# Patient Record
Sex: Female | Born: 1955 | Race: White | Hispanic: No | Marital: Married | Smoking: Former smoker
Health system: Southern US, Community
[De-identification: ages and names within clinical notes are randomized; demographics above are authoritative.]

## PROBLEM LIST (undated history)

## (undated) DIAGNOSIS — I700 Atherosclerosis of aorta: Secondary | ICD-10-CM

## (undated) DIAGNOSIS — B029 Zoster without complications: Secondary | ICD-10-CM

## (undated) DIAGNOSIS — N762 Acute vulvitis: Secondary | ICD-10-CM

## (undated) DIAGNOSIS — E063 Autoimmune thyroiditis: Secondary | ICD-10-CM

## (undated) DIAGNOSIS — T8859XA Other complications of anesthesia, initial encounter: Secondary | ICD-10-CM

## (undated) DIAGNOSIS — E785 Hyperlipidemia, unspecified: Secondary | ICD-10-CM

## (undated) DIAGNOSIS — F32A Depression, unspecified: Secondary | ICD-10-CM

## (undated) DIAGNOSIS — E039 Hypothyroidism, unspecified: Secondary | ICD-10-CM

## (undated) DIAGNOSIS — Z780 Asymptomatic menopausal state: Secondary | ICD-10-CM

## (undated) DIAGNOSIS — F329 Major depressive disorder, single episode, unspecified: Secondary | ICD-10-CM

## (undated) DIAGNOSIS — N3281 Overactive bladder: Secondary | ICD-10-CM

## (undated) DIAGNOSIS — E559 Vitamin D deficiency, unspecified: Secondary | ICD-10-CM

## (undated) DIAGNOSIS — M8580 Other specified disorders of bone density and structure, unspecified site: Secondary | ICD-10-CM

## (undated) DIAGNOSIS — G4733 Obstructive sleep apnea (adult) (pediatric): Secondary | ICD-10-CM

## (undated) DIAGNOSIS — R55 Syncope and collapse: Secondary | ICD-10-CM

## (undated) DIAGNOSIS — D510 Vitamin B12 deficiency anemia due to intrinsic factor deficiency: Secondary | ICD-10-CM

## (undated) DIAGNOSIS — H3552 Pigmentary retinal dystrophy: Secondary | ICD-10-CM

## (undated) DIAGNOSIS — E538 Deficiency of other specified B group vitamins: Secondary | ICD-10-CM

## (undated) DIAGNOSIS — F419 Anxiety disorder, unspecified: Secondary | ICD-10-CM

## (undated) DIAGNOSIS — M810 Age-related osteoporosis without current pathological fracture: Secondary | ICD-10-CM

## (undated) DIAGNOSIS — T4145XA Adverse effect of unspecified anesthetic, initial encounter: Secondary | ICD-10-CM

## (undated) DIAGNOSIS — B009 Herpesviral infection, unspecified: Secondary | ICD-10-CM

## (undated) HISTORY — PX: TUBAL LIGATION: SHX77

## (undated) HISTORY — PX: TONSILLECTOMY: SUR1361

## (undated) HISTORY — PX: UPPER GI ENDOSCOPY: SHX6162

## (undated) HISTORY — PX: COLONOSCOPY: SHX174

## (undated) HISTORY — PX: CATARACT EXTRACTION: SUR2

## (undated) HISTORY — PX: CYST REMOVAL NECK: SHX6281

---

## 2004-08-11 ENCOUNTER — Ambulatory Visit: Payer: Self-pay | Admitting: Obstetrics and Gynecology

## 2005-01-20 ENCOUNTER — Emergency Department: Payer: Self-pay | Admitting: Emergency Medicine

## 2005-08-14 ENCOUNTER — Ambulatory Visit: Payer: Self-pay | Admitting: Obstetrics and Gynecology

## 2006-08-17 ENCOUNTER — Ambulatory Visit: Payer: Self-pay | Admitting: Obstetrics and Gynecology

## 2007-09-27 ENCOUNTER — Ambulatory Visit: Payer: Self-pay | Admitting: Obstetrics and Gynecology

## 2007-10-28 ENCOUNTER — Ambulatory Visit: Payer: Self-pay | Admitting: Unknown Physician Specialty

## 2008-11-15 ENCOUNTER — Ambulatory Visit: Payer: Self-pay | Admitting: Obstetrics and Gynecology

## 2009-07-18 ENCOUNTER — Ambulatory Visit: Payer: Self-pay | Admitting: Obstetrics and Gynecology

## 2009-09-18 ENCOUNTER — Emergency Department: Payer: Self-pay | Admitting: Internal Medicine

## 2009-11-19 ENCOUNTER — Ambulatory Visit: Payer: Self-pay | Admitting: Obstetrics and Gynecology

## 2010-06-10 NOTE — Assessment & Plan Note (Signed)
NAMEDINESHA, TWIGGS NO.:  [id number]   MEDICAL RECORD NO.:  [id number]          PATIENT TYPE:  POB   LOCATION:  [facility] at [facility]         FACILITY:  [facility]   PHYSICIAN:  Argentina Donovan, MD        DATE OF BIRTH:  Aug 24, 1955   DATE OF SERVICE:  07/18/2009                                  CLINIC NOTE   The patient is a 55 year old Caucasian female gravida 2, para 1-0-1-1,  who went through menopause, stopping periods 5 years ago and has not  been on estrogen since then, however, she has been tested and was found  to have osteopenia, very close to osteoporosis.  She has been since  menopause on Boniva once a month, vitamin D, and calcium.  She has also  been on Prozac for menopausal symptoms, acyclovir, omeprazole and  thyroid.  The reason she is in is that 3-4 days ago because of a density  scan which had not changed fairly in 2 years since the last one, but was  close to osteoporosis on one of the readings in one of the hips  apparently.  They started her on Prempro in addition to all the things  she has been on.  She came in for a consultation to see whether I  thought that was a wise thing to do.  The fact being that she has been 5  years without estrogen and then restarting follows in the line of the  nurses study in which all those women had been off estrogen for some  time after menopause prior to starting and showed an increase in  cardiovascular problems.  I told her I did not think it was worth that  it would be very unlikely in view of what she is taking rather Boniva as  well as a calcium and vitamin D that it will improve the structure or  the density of her bones.  She is not doing any exercises, although she  has all the exercise equipment at home, and I have encouraged her to  start doing that, that probably would have a better effect on bone than  just adding the Provera.  She does have some vaginal dryness.  I have  talked to her about using the  lubricants that are available and told her  there was also a relatively new study that showed there was really a  minimal of systemic effect by using just estrogen tablets in the vagina  to reestablish the superficial lining and increase lubrication if that  became necessary.  She seemed to go along with this.  She seemed to be  pleased with the advice.  She has not bothered terribly by hot flashes  or sleep disturbances so for the lack of severe postmenopausal symptoms  the patient directly feels with the exception of a light vaginal  dryness.  I think that the risks are greater than the advantage of her  adding Prempro to her current regime.   IMPRESSION:  Osteopenia.  The patient with early menopause and symptoms  fairly well controlled.           ______________________________  Argentina Donovan, MD  PR/MEDQ  D:  07/18/2009  T:  07/19/2009  Job:  [id number]

## 2010-12-03 ENCOUNTER — Ambulatory Visit: Payer: Self-pay | Admitting: Obstetrics and Gynecology

## 2011-12-08 ENCOUNTER — Ambulatory Visit: Payer: Self-pay | Admitting: Obstetrics and Gynecology

## 2012-01-20 ENCOUNTER — Emergency Department: Payer: Self-pay | Admitting: Emergency Medicine

## 2012-01-20 LAB — TROPONIN I: Troponin-I: <0.02 ng/mL

## 2012-01-20 LAB — COMPREHENSIVE METABOLIC PANEL
Albumin: 4.1 g/dL (ref 3.4–5.0)
Anion Gap: 7 (ref 7–16)
BUN: 14 mg/dL (ref 7–18)
Calcium, Total: 9.1 mg/dL (ref 8.5–10.1)
Co2: 24 mmol/L (ref 21–32)
EGFR (African American): >60
Glucose: 101 mg/dL — ABNORMAL HIGH (ref 65–99)
Osmolality: 287 (ref 275–301)
Potassium: 4 mmol/L (ref 3.5–5.1)
SGOT(AST): 35 U/L (ref 15–37)
SGPT (ALT): 34 U/L (ref 12–78)

## 2012-01-20 LAB — CBC
HCT: 43.6 % (ref 35.0–47.0)
Platelet: 205 10*3/uL (ref 150–440)
RBC: 4.82 10*6/uL (ref 3.80–5.20)
RDW: 13.5 % (ref 11.5–14.5)
WBC: 9.8 10*3/uL (ref 3.6–11.0)

## 2012-01-20 LAB — URINALYSIS, COMPLETE
Bacteria: NONE SEEN
Bilirubin,UR: NEGATIVE
Protein: NEGATIVE
RBC,UR: 1 /HPF (ref 0–5)
Specific Gravity: 1.017 (ref 1.003–1.030)
Squamous Epithelial: <1
WBC UR: 2 /HPF (ref 0–5)

## 2012-01-21 ENCOUNTER — Observation Stay: Payer: Self-pay | Admitting: Internal Medicine

## 2012-01-21 LAB — COMPREHENSIVE METABOLIC PANEL
Albumin: 3.7 g/dL (ref 3.4–5.0)
Anion Gap: 9 (ref 7–16)
BUN: 16 mg/dL (ref 7–18)
Bilirubin,Total: 0.6 mg/dL (ref 0.2–1.0)
Co2: 22 mmol/L (ref 21–32)
Creatinine: 0.78 mg/dL (ref 0.60–1.30)
EGFR (African American): >60
Glucose: 81 mg/dL (ref 65–99)
Potassium: 3.5 mmol/L (ref 3.5–5.1)
Sodium: 141 mmol/L (ref 136–145)
Total Protein: 6.9 g/dL (ref 6.4–8.2)

## 2012-01-21 LAB — CBC WITH DIFFERENTIAL/PLATELET
Basophil #: 0.2 10*3/uL — ABNORMAL HIGH (ref 0.0–0.1)
Lymphocyte #: 1.9 10*3/uL (ref 1.0–3.6)
MCH: 29.5 pg (ref 26.0–34.0)
MCV: 90 fL (ref 80–100)
Monocyte #: 0.6 x10 3/mm (ref 0.2–0.9)
Neutrophil #: 4.7 10*3/uL (ref 1.4–6.5)
Platelet: 189 10*3/uL (ref 150–440)
RDW: 13.3 % (ref 11.5–14.5)

## 2012-01-21 LAB — URINALYSIS, COMPLETE
Bilirubin,UR: NEGATIVE
Glucose,UR: NEGATIVE mg/dL (ref 0–75)
Ph: 6 (ref 4.5–8.0)
Protein: NEGATIVE
Specific Gravity: 1.004 (ref 1.003–1.030)
WBC UR: 1 /HPF (ref 0–5)

## 2012-01-22 LAB — CBC WITH DIFFERENTIAL/PLATELET
Basophil %: 0.8 %
Eosinophil %: 2.3 %
HGB: 12.8 g/dL (ref 12.0–16.0)
Lymphocyte #: 1.8 10*3/uL (ref 1.0–3.6)
MCH: 30 pg (ref 26.0–34.0)
MCHC: 33.7 g/dL (ref 32.0–36.0)
MCV: 89 fL (ref 80–100)
Monocyte #: 0.5 x10 3/mm (ref 0.2–0.9)
Neutrophil #: 2.5 10*3/uL (ref 1.4–6.5)
Neutrophil %: 50.6 %
Platelet: 176 10*3/uL (ref 150–440)
RBC: 4.28 10*6/uL (ref 3.80–5.20)

## 2012-01-22 LAB — TSH: Thyroid Stimulating Horm: 1.56 u[IU]/mL

## 2012-01-22 LAB — OCCULT BLOOD X 1 CARD TO LAB, STOOL: Occult Blood, Feces: NEGATIVE

## 2012-01-25 LAB — PATHOLOGY REPORT

## 2012-01-27 HISTORY — PX: BREAST CYST ASPIRATION: SHX578

## 2012-12-08 ENCOUNTER — Ambulatory Visit: Payer: Self-pay | Admitting: Obstetrics and Gynecology

## 2013-03-20 ENCOUNTER — Observation Stay: Payer: Self-pay | Admitting: Internal Medicine

## 2013-03-20 LAB — CBC
HCT: 40.1 % (ref 35.0–47.0)
HGB: 13.3 g/dL (ref 12.0–16.0)
MCH: 29.8 pg (ref 26.0–34.0)
MCHC: 33.1 g/dL (ref 32.0–36.0)
MCV: 90 fL (ref 80–100)
Platelet: 179 10*3/uL (ref 150–440)
RBC: 4.46 10*6/uL (ref 3.80–5.20)
RDW: 13.5 % (ref 11.5–14.5)
WBC: 11.2 10*3/uL — ABNORMAL HIGH (ref 3.6–11.0)

## 2013-03-20 LAB — COMPREHENSIVE METABOLIC PANEL
ALK PHOS: 95 U/L
AST: 36 U/L (ref 15–37)
Albumin: 3.5 g/dL (ref 3.4–5.0)
Anion Gap: 5 — ABNORMAL LOW (ref 7–16)
BUN: 18 mg/dL (ref 7–18)
Bilirubin,Total: 0.9 mg/dL (ref 0.2–1.0)
Calcium, Total: 8.6 mg/dL (ref 8.5–10.1)
Chloride: 109 mmol/L — ABNORMAL HIGH (ref 98–107)
Co2: 24 mmol/L (ref 21–32)
Creatinine: 0.76 mg/dL (ref 0.60–1.30)
EGFR (Non-African Amer.): >60
GLUCOSE: 95 mg/dL (ref 65–99)
OSMOLALITY: 277 (ref 275–301)
Potassium: 3.6 mmol/L (ref 3.5–5.1)
SGPT (ALT): 34 U/L (ref 12–78)
SODIUM: 138 mmol/L (ref 136–145)
Total Protein: 6.6 g/dL (ref 6.4–8.2)

## 2013-03-20 LAB — URINALYSIS, COMPLETE
BILIRUBIN, UR: NEGATIVE
Bacteria: NONE SEEN
Glucose,UR: NEGATIVE mg/dL (ref 0–75)
LEUKOCYTE ESTERASE: NEGATIVE
NITRITE: NEGATIVE
Ph: 5 (ref 4.5–8.0)
Protein: NEGATIVE
RBC,UR: 3 /HPF (ref 0–5)
Specific Gravity: 1.025 (ref 1.003–1.030)
WBC UR: 3 /HPF (ref 0–5)

## 2013-03-20 LAB — LIPASE, BLOOD: Lipase: 120 U/L (ref 73–393)

## 2013-03-21 LAB — BASIC METABOLIC PANEL
ANION GAP: 7 (ref 7–16)
BUN: 9 mg/dL (ref 7–18)
CHLORIDE: 113 mmol/L — AB (ref 98–107)
CO2: 23 mmol/L (ref 21–32)
Calcium, Total: 7.9 mg/dL — ABNORMAL LOW (ref 8.5–10.1)
Creatinine: 0.68 mg/dL (ref 0.60–1.30)
EGFR (African American): >60
EGFR (Non-African Amer.): >60
GLUCOSE: 111 mg/dL — AB (ref 65–99)
Osmolality: 284 (ref 275–301)
POTASSIUM: 3.6 mmol/L (ref 3.5–5.1)
SODIUM: 143 mmol/L (ref 136–145)

## 2013-03-21 LAB — CBC WITH DIFFERENTIAL/PLATELET
BASOS PCT: 0.2 %
Basophil #: 0 10*3/uL (ref 0.0–0.1)
EOS PCT: 0.4 %
Eosinophil #: 0 10*3/uL (ref 0.0–0.7)
HCT: 35.2 % (ref 35.0–47.0)
HGB: 11.9 g/dL — ABNORMAL LOW (ref 12.0–16.0)
LYMPHS PCT: 14.6 %
Lymphocyte #: 1 10*3/uL (ref 1.0–3.6)
MCH: 30.2 pg (ref 26.0–34.0)
MCHC: 33.7 g/dL (ref 32.0–36.0)
MCV: 90 fL (ref 80–100)
Monocyte #: 0.7 x10 3/mm (ref 0.2–0.9)
Monocyte %: 9.6 %
NEUTROS ABS: 5.1 10*3/uL (ref 1.4–6.5)
Neutrophil %: 75.2 %
PLATELETS: 144 10*3/uL — AB (ref 150–440)
RBC: 3.94 10*6/uL (ref 3.80–5.20)
RDW: 13.7 % (ref 11.5–14.5)
WBC: 6.8 10*3/uL (ref 3.6–11.0)

## 2013-03-21 LAB — OCCULT BLOOD X 1 CARD TO LAB, STOOL: Occult Blood, Feces: NEGATIVE

## 2013-03-21 LAB — MAGNESIUM: MAGNESIUM: 1.8 mg/dL

## 2013-03-22 LAB — CBC WITH DIFFERENTIAL/PLATELET
BASOS ABS: 0 10*3/uL (ref 0.0–0.1)
Basophil %: 0.3 %
EOS ABS: 0.1 10*3/uL (ref 0.0–0.7)
Eosinophil %: 2.1 %
HCT: 34 % — ABNORMAL LOW (ref 35.0–47.0)
HGB: 11.4 g/dL — AB (ref 12.0–16.0)
Lymphocyte #: 1.6 10*3/uL (ref 1.0–3.6)
Lymphocyte %: 23.4 %
MCH: 30.2 pg (ref 26.0–34.0)
MCHC: 33.7 g/dL (ref 32.0–36.0)
MCV: 90 fL (ref 80–100)
MONO ABS: 0.9 x10 3/mm (ref 0.2–0.9)
Monocyte %: 12.8 %
Neutrophil #: 4.2 10*3/uL (ref 1.4–6.5)
Neutrophil %: 61.4 %
Platelet: 135 10*3/uL — ABNORMAL LOW (ref 150–440)
RBC: 3.79 10*6/uL — ABNORMAL LOW (ref 3.80–5.20)
RDW: 13.6 % (ref 11.5–14.5)
WBC: 6.9 10*3/uL (ref 3.6–11.0)

## 2013-03-22 LAB — BASIC METABOLIC PANEL
Anion Gap: 3 — ABNORMAL LOW (ref 7–16)
BUN: 4 mg/dL — AB (ref 7–18)
CALCIUM: 8.2 mg/dL — AB (ref 8.5–10.1)
CHLORIDE: 116 mmol/L — AB (ref 98–107)
Co2: 26 mmol/L (ref 21–32)
Creatinine: 0.78 mg/dL (ref 0.60–1.30)
EGFR (African American): >60
GLUCOSE: 84 mg/dL (ref 65–99)
Osmolality: 285 (ref 275–301)
Potassium: 3.2 mmol/L — ABNORMAL LOW (ref 3.5–5.1)
Sodium: 145 mmol/L (ref 136–145)

## 2013-03-22 LAB — MAGNESIUM: Magnesium: 1.8 mg/dL

## 2013-03-24 LAB — STOOL CULTURE

## 2013-04-24 DIAGNOSIS — E559 Vitamin D deficiency, unspecified: Secondary | ICD-10-CM | POA: Insufficient documentation

## 2013-04-24 DIAGNOSIS — D510 Vitamin B12 deficiency anemia due to intrinsic factor deficiency: Secondary | ICD-10-CM | POA: Insufficient documentation

## 2013-12-04 DIAGNOSIS — E042 Nontoxic multinodular goiter: Secondary | ICD-10-CM | POA: Insufficient documentation

## 2013-12-19 ENCOUNTER — Ambulatory Visit: Payer: Self-pay | Admitting: Obstetrics and Gynecology

## 2014-05-15 NOTE — H&P (Signed)
PATIENT NAME:  Brenda Oneill, Brenda Oneill MR#:  [id number] DATE OF BIRTH:  [birth year]  DATE OF ADMISSION:  01/21/2012  PRIMARY CARE PHYSICIAN: Ramonita Lab, MD  REASON FOR ADMISSION: Intractable nausea and vomiting with abdominal pain.   HISTORY OF PRESENT ILLNESS: The patient is a 59 year old female with a history of pernicious anemia, hypothyroidism and hyperlipidemia who was in the Emergency Room last night with acid reflux, diarrhea and anorexia. There was questionable coffee-ground emesis. Her CBC was normal and she was sent out with the diagnosis of gastritis. She presents now with persistent nausea, vomiting, diarrhea and anorexia. Having epigastric pain. Unable to keep even water down. She is now admitted for further evaluation.   PAST MEDICAL HISTORY: 1. Pernicious anemia.  2. Hypothyroidism.  3. History of retinitis pigmentosa.  4. Osteopenia with vitamin D deficiency.  5. Remote history of shingles.  6. Hyperlipidemia.  7. History of near syncope.  8. Status post tubal ligation.  9. Status post cataract surgery.   MEDICATIONS: 1. Armour thyroid 30 mg p.o. daily.  2. Vitamin D 250,000 units p.o. 2 times per week.  3. B12 1000 mcg IM q. month.  4. Prozac 20 mg p.o. daily.  5. Premarin vaginal cream twice per week.   ALLERGIES: MACRODANTIN.   SOCIAL HISTORY: Negative for alcohol or tobacco abuse.   FAMILY HISTORY: Positive for heart disease and hypertension. Also positive for lung cancer and Parkinson's. Had a great aunt with breast cancer. No history of colon cancer in the family.   REVIEW OF SYSTEMS: As per HPI.   PHYSICAL EXAMINATION:  GENERAL: The patient is in no acute distress.   VITAL SIGNS: Currently blood pressure is 104/62 with a weight of 141.   HEENT: Exam is unremarkable.   NECK: Supple without JVD.   PULMONARY: Lungs are clear. No wheezes or rales. No rhonchi.   CARDIAC: Regular rate and rhythm with normal S1 and S2. No significant rubs, murmurs or gallops.  PMI is nondisplaced. Chest wall is nontender.   ABDOMEN: Soft with some epigastric tenderness. No rebound or guarding. Normoactive bowel sounds. No organomegaly or masses were appreciated. No hernias or bruits were noted.   EXTREMITIES: No clubbing, cyanosis or edema. Pulses were 2+ bilaterally.   SKIN: Warm and dry without rash or lesions.   NEUROLOGIC: Cranial nerves II through XII grossly intact. Deep tendon reflexes were symmetric. Motor and sensory exam is nonfocal.   PSYCHIATRIC: Examination reveals a patient who is alert and oriented to person, place and time. She was cooperative and used good judgment.   ASSESSMENT: 1. Intractable nausea and vomiting.  2. Epigastric abdominal pain.  3. Anorexia.  4. Diarrhea.  5. Hypothyroidism.  6. Vitamin D deficiency.  7. Pernicious anemia.   PLAN: The patient will be admitted to the floor with IV fluids and maintained on a clear liquid diet. We will guaiac all stools and consult gastroenterology. We will obtain an abdominal ultrasound. We will send off routine labs including a repeat CBC and a H. pylori antibody. We will check a urinalysis. Follow up hemoglobin and thyroid in the morning. Further treatment and evaluation will depend upon the patient's progress.   TOTAL TIME SPENT: 50 minutes.  ____________________________ Leonie Douglas Doy Hutching, MD jds:sb D: 01/21/2012 11:19:30 ET T: 01/21/2012 11:30:42 ET JOB#: [id number]  cc: Leonie Douglas. Doy Hutching, MD, <Dictator> [name], MD Bernestine Holsapple Lennice Sites MD ELECTRONICALLY SIGNED 01/21/2012 15:58

## 2014-05-15 NOTE — Consult Note (Signed)
Brief Consult Note: Diagnosis: Intractable nausea and vomiting.  Exacerbation of reflux with associated diarrhea.  Abdominal pain to epigastrium and right upper quadrant.  Dehydration. Differential inclusive of gastrenteritis, gallbladder disease as well as acute gastritis.   Consult note dictated.   Recommend to proceed with surgery or procedure.   Orders entered.   Discussed with Attending MD.   Comments: Patient's presentation discussed with Dr. Verdie Shire.  Will proceed with abdominal ultrasound as ordered.  If possible finding in correlatiion with current symptoms will not proceed with endoscopy evaluation.  Tentatively patient is scheduled for EGD tomorrow to allow direct luminal evaluation.  Order placed.  Clear liquid diet today and NPO after midnight.  Stool studies ordered with differential being inclusive of gastroenteritis.  Comprehensive stool, O&P with giardia and Stool for WBC.  Will continue to monitor hemodynamic status as well as laboratory studies..  Electronic Signatures: Payton Emerald (NP)  (Signed 26-Dec-13 13:45)  Authored: Brief Consult Note   Last Updated: 26-Dec-13 13:45 by Payton Emerald (NP)

## 2014-05-15 NOTE — Consult Note (Signed)
Chief Complaint:   Subjective/Chief Complaint See Dawn Harrison's notes from yest. Feels much better. No heartburn this AM. No RUQ pain . U/S neg. Stool studies pending.   VITAL SIGNS/ANCILLARY NOTES: **Vital Signs.:   27-Dec-13 03:50   Vital Signs Type Routine   Temperature Temperature (F) 98.2   Celsius 36.7   Temperature Source Oral   Pulse Pulse 62   Respirations Respirations 20   Systolic BP Systolic BP 376   Diastolic BP (mmHg) Diastolic BP (mmHg) 65   Mean BP 77   Pulse Ox % Pulse Ox % 97   Pulse Ox Activity Level  At rest   Oxygen Delivery Room Air/ 21 %   Brief Assessment:   Cardiac Regular    Respiratory clear BS    Gastrointestinal Normal   Lab Results:  Thyroid:  27-Dec-13 05:04    Thyroid Stimulating Hormone 1.56 (0.45-4.50 (International Unit)  ----------------------- Pregnant patients have  different reference  ranges for TSH:  - - - - - - - - - -  Pregnant, first trimetser:  0.36 - 2.50 uIU/mL)   Radiology Results: [place]:    26-Dec-13 14:40, US Abdomen General Survey   US Abdomen General Survey    REASON FOR EXAM:    abd pain, N/V  COMMENTS:       PROCEDURE: [place]  - US ABDOMEN GENERAL SURVEY  - Jan 21 2012  2:40PM     RESULT: The liver exhibits normal echotexture with no focal mass nor   ductal dilation. Portal venous flow is normal in directiontoward the   liver. The gallbladder is adequately distended with no evidence of   stones, wall thickening, or pericholecystic fluid. There is no positive   sonographic Murphy's sign. The common bile duct measures 3.1 mm in   diameter. The pancreas could only be partially evaluated due to bowel   gas. The spleen, abdominal aorta, inferior vena cava, and kidneys are   normal in appearance. There is no evidence of ascites.    IMPRESSION:  There is limited evaluation of the pancreas. Otherwise no     acute abnormality of the visualized abdominal viscera is seen.     Dictation Site: 2        Verified By:  DAVID A. Martinique, M.D., MD   Assessment/Plan:  Assessment/Plan:   Assessment GERD. N/V. Feels back to normal.    Plan EGD later this AM. Hopefully, discharge to home afterwards on daily PPI. THanks.   Electronic Signatures: Verdie Shire (MD)  (Signed 27-Dec-13 07:55)  Authored: Chief Complaint, VITAL SIGNS/ANCILLARY NOTES, Brief Assessment, Lab Results, Radiology Results, Assessment/Plan   Last Updated: 27-Dec-13 07:55 by Verdie Shire (MD)

## 2014-05-15 NOTE — Consult Note (Signed)
EGD only showed benign gastric polyps. No ulcers or obvious GERD. Nevertheless, continue daily PPI. Solids ordered. If tolerated, patient can go home today. Will sign off. Thanks.  Electronic Signatures: Verdie Shire (MD)  (Signed on 27-Dec-13 11:14)  Authored  Last Updated: 27-Dec-13 11:14 by Verdie Shire (MD)

## 2014-05-15 NOTE — Consult Note (Signed)
   Electronic Signatures: Payton Emerald (NP)  (Signed 26-Dec-13 13:46)  Authored: Brief Consult Note   Last Updated: 26-Dec-13 13:46 by Payton Emerald (NP)

## 2014-05-18 NOTE — Consult Note (Signed)
PATIENT NAME:  Brenda Oneill, Brenda Oneill MR#:  [id number] DATE OF BIRTH:  Jun 25, 1955  DATE OF CONSULTATION:  01/21/2012  REFERRING PHYSICIAN:   CONSULTING PHYSICIAN:  [name], MD/Dawn Ruthe Mannan, NP  PRIMARY CARE PHYSICIAN:  Tama High, III, MD  REASON FOR CONSULTATION:  Nausea and vomiting with abdominal pain.   HISTORY OF PRESENT ILLNESS:  The patient is a 59 year old Caucasian female with a known history of pernicious anemia, hypothyroidism, hyperlipidemia, osteopenia as well as menopausal syndrome. She was seen at the [facility] Emergency Room yesterday evening for the concern of intractable nausea, vomiting, diarrhea as well as associated abdominal pain. The patient states this past Sunday she had eaten a hot dog with chili and stated that she thought that it was "not good." Monday she awoke with reflux significant for water brash and excessive belching and that morning also acute onset of diarrhea. She took Maalox, Mylanta and Prilosec with no improvement in her symptoms. Tuesday there was no diarrhea, but reflux was still present significant for water brash with more of a bile appearance initially and then followed by a second occurrence of coffee-ground "looking emesis" thought possibly to be dried blood. During the rest of the day, the reflux remained severe and then yesterday the diarrhea and reflux as well are still being present. She had a very small amount of macaroni and cheese and became nauseated. At 5:00 p.m. yesterday, she had an occurrence of diarrhea. No evidence of melena though through the onset of these symptoms and no evidence of bright red blood over the past couple of days. A month ago she had evidence of 1 occurrence of bright red blood and has a known history of internal hemorrhoids. She had a colonoscopy that was performed by Dr. Gaylyn Cheers on 10/28/2007 with the finding of internal hemorrhoids. No history of EGD in the past. No similar symptoms to this severity. Known  history of reflux, but has been off of PPI therapy for years. When she was leaving her family function yesterday she had the occurrence of diarrhea walking to the car and vomited. Given darkness, she was unable to notice if it was coffee-ground in appearance at that time. In the ER yesterday evening, she was given IV fluids, prescription for Zofran as well as PPI therapy and diagnosed with gastritis. This morning awoke, drank a half bottle of water and then significant for water brash again. Ended up taking a shower preparing to come to the doctor, drank the other half of water and significant for water brash. She has had 1 episode of diarrhea this morning. Normal bowel pattern is usually every other day. Very rare will she take Advil for a headache and has had intentional weight loss recently.   PAST MEDICAL HISTORY:  Pernicious anemia, hypothyroidism, history of retinitis pigmentosa, osteopenia with vitamin D deficiency, remote history of shingles, hyperlipidemia, near syncopal episode, menopausal syndrome.   PAST SURGICAL HISTORY:  Tonsillectomy, tubal ligation, cataract surgery bilaterally, C-section in 1976, appendectomy.  MEDICATIONS:  Armour Thyroid 30 mg once a day, vitamin D 250,000 units twice a week, vitamin B12, 1000 mcg injection monthly, Prozac 20 mg a day, Premarin vaginal cream twice a week and then acyclovir as directed.   ALLERGIES:  MACRODANTIN.   SOCIAL HISTORY:  No tobacco. No alcohol use. Married, 1 child, works for [employer]   FAMILY HISTORY:  Significant for heart disease and hypertension. Father has a history of lung cancer as well as Parkinson's disease and a great  aunt with breast cancer. No family history of colon cancer.   REVIEW OF SYSTEMS:  All 10 systems reviewed and checked, otherwise unremarkable as stated above except for the complaint of mild dizziness.   PHYSICAL EXAMINATION: VITAL SIGNS: Temperature 97.4, pulse 64, respirations 20, blood pressure 129/80 and pulse  oximetry 99% on room air.  GENERAL: Well-developed, well-nourished 59 year old Caucasian female in no acute distress noted. Pleasant, appears to be resting comfortably in bed.  HEENT: Normocephalic, atraumatic. Pupils equal, reactive to light. Conjunctivae clear. Sclerae anicteric. Lips are dry with evidence of dehydration.  NECK: Supple. Trachea midline. No lymphadenopathy or thyromegaly.  PULMONARY: Symmetric rise and fall of chest. Clear to auscultation throughout.  CARDIOVASCULAR: Regular rhythm, S1, S2. No murmurs, no gallops.  ABDOMEN: Soft, nondistended. Bowel sounds in 4 quadrants. No bruits. No masses. Tenderness noted in epigastric right upper quadrant. No evidence of hepatosplenomegaly. No CVA tenderness.  RECTAL: Deferred.  MUSCULOSKELETAL: No contractures. No clubbing.  EXTREMITIES: No edema.  SKIN: Color pink, warm, dry, fair skin turgor. No lesions. No rashes.  NEUROLOGICAL: No gross neurological deficits noted.  PSYCHIATRIC: Alert and oriented x 4. Appropriate affect and mood.   DIAGNOSTIC DATA:  Laboratory studies reviewed from yesterday when seen in the Emergency Room are glucose 101, chloride 113, otherwise within normal limits. Lipase was 166. Hepatic panel was within normal limits. Troponin was less than 0.02. CBC was within normal limits. Urinalysis revealed +1 ketones and +2 blood. Chest x-ray, PA and lateral, revealed mild hyperinflation which may be voluntary which could reflect underlying reactive airway disease or COPD. No evidence of pneumonia. A small amount of gas under the left hemidiaphragm which likely lies within the stomach.   IMPRESSION:  Intractable nausea and vomiting, exacerbation of reflux with associated diarrhea, abdominal pain, epigastric as well as right upper quadrant, dehydration. Differential includes gastritis, gallbladder disease as well as acute gastritis.   PLAN:  The patient's presentation was discussed with Dr. Verdie Shire. We will proceed with  abdominal ultrasound as ordered. If positive findings in correlation with current symptoms, we will proceed with upper endoscopy evaluation. Tentatively, the patient is scheduled for an EGD tomorrow to allow direct luminal evaluation, order placed. Clear liquid diet today, n.p.o. after midnight. Stool studies ordered with differential being inclusive of gastroenteritis, comprehensive stool, ova and parasite with Giardia and stool for WBC, and we will continue to monitor hemodynamic status as well as laboratory studies.   These services provided by Payton Emerald , NP under collaborative agreement with Dr. Verdie Shire.  ____________________________ Payton Emerald, NP dsh:si D: 01/21/2012 13:45:00 ET T: 01/21/2012 17:03:23 ET JOB#: [id number]  cc: Payton Emerald, NP, <Dictator> Payton Emerald MD ELECTRONICALLY SIGNED 02/01/2012 16:43

## 2014-05-19 NOTE — Discharge Summary (Signed)
PATIENT NAME:  Brenda Oneill, Brenda Oneill MR#:  [id number] DATE OF BIRTH:  08-Aug-1955  DATE OF ADMISSION:  03/20/2013 DATE OF DISCHARGE:  03/22/2013   FINAL DIAGNOSES:  1. Dehydration.  2. Nausea, vomiting, likely viral gastroenteritis, and cause of the dehydration.  3. Hypothyroidism.  4. Depression with anxiety.   HISTORY AND PHYSICAL: Please see dictated admission history and physical.   [place]: The patient was admitted with nausea, vomiting, diarrhea. Fortunately, this improved rapidly. She was placed on IV fluids for dehydration. Her diet was slowly advanced. On the day of discharge, she is tolerating solid foods, she has been ambulating independently and feels ready to go home. She will follow a bland diet for 1 week and then advance this to regular diet. She may return to work on Monday, physical activity otherwise would be as tolerated. She will follow up in our office in the next 2 to 4 weeks.   DISCHARGE MEDICATIONS:  1. Armour Thyroid 30 mg p.o. daily.  2. Fluoxetine 20 mg p.o. daily.  3. Vitamin B12 1000 mcg IM monthly.  4. Vitamin D2 50,000 units p.o. 2 times a week.  5. Zovirax 200 mg p.o. daily.  6. Alprazolam 0.25 mg p.o. at bedtime as needed for sleep.  7. Ondansetron 4 mg p.o. t.i.d. as needed for nausea or vomiting.   ____________________________ [name], MD bjk:lb D: 03/22/2013 12:50:16 ET T: 03/22/2013 14:13:06 ET JOB#: [id number]  cc: [name], MD, <Dictator> Ramonita Lab MD ELECTRONICALLY SIGNED 03/23/2013 10:48

## 2014-05-19 NOTE — H&P (Signed)
PATIENT NAME:  Brenda Oneill, Brenda Oneill MR#:  [id number] DATE OF BIRTH:  [birth year]  DATE OF ADMISSION:  03/20/2013  PRIMARY CARE PHYSICIAN:  Tama High III, MD  CHIEF COMPLAINT: Abdominal pain, nausea, vomiting, diarrhea.   HISTORY OF PRESENT ILLNESS: This is a 59 year old female who presents with vomiting too many times to count, diarrhea too many times to count, starting last night. No blood in the vomit, no blood in the stool. This morning vomited 3 times after drinking a small amount of Gatorade and unable to keep anything down. Today, she felt faint. The EMS stated that her blood pressure was a little bit low. Of note, she did come in contact with a lady that did her hair on Saturday that had a stomach virus. She did eat a piece of chicken at a restaurant and then, 4 hours later, developed symptoms. Of note, she did have a similar episode back in 2013 right after Christmas with some persistent vomiting.   PAST MEDICAL HISTORY: Pernicious anemia, hypothyroidism, retinitis pigmentosa, osteopenia with vitamin D deficiency, shingles in the past, hyperlipidemia.   PAST SURGICAL HISTORY: Tubal ligation, cataract surgery and eye surgery.   ALLERGIES: MACRODANTIN.   SOCIAL HISTORY: Works for the [employer]. No alcohol. No smoking. No drug use.   FAMILY HISTORY: Mother with diabetes and arthritis. Father with diabetes, heart disease.   MEDICATIONS:  Include acyclovir 200 mg daily, Xanax 0.25 mg at bedtime, Armour Thyroid 30 mg daily, fluoxetine 20 mg daily, vitamin B 1000 mcg/mL injectable solution IM once a month, vitamin D2 50,000 units twice a week on Sunday and Wednesday.   REVIEW OF SYSTEMS: CONSTITUTIONAL: Positive for chills. No fever. No sweats. Positive for weakness.  EYES: No blurry vision.  EARS, NOSE, MOUTH AND THROAT: No sore throat. Positive for hoarse voice. CARDIOVASCULAR: No chest pain. No palpitations.  RESPIRATORY: No shortness of breath. No cough. No  sputum. No hemoptysis.  GASTROINTESTINAL: Positive for nausea. Positive for vomiting. No hematemesis. Positive for abdominal pain. Positive for diarrhea. No bright red blood per rectum. No melena.  GENITOURINARY: No burning on urination. No hematuria.  MUSCULOSKELETAL: No joint pain or muscle pain.  INTEGUMENT: No rashes or eruptions.  NEUROLOGIC: Felt faint.  ENDOCRINE: Positive for hypothyroidism.  HEMATOLOGIC AND LYMPHATIC: No anemia.   PHYSICAL EXAMINATION: VITAL SIGNS: Temperature 97, pulse 70, respirations 20, blood pressure 112/55, pulse ox 99% on room air.  GENERAL: No respiratory distress.  EYES: Conjunctivae and lids normal. Pupils equal, round and reactive to light. Extraocular muscles intact. No nystagmus.  EARS, NOSE, MOUTH AND THROAT: Tympanic membranes: No erythema. Nasal mucosa: No erythema. Throat: No erythema. No exudate seen. Lips and gums: No lesions.  NECK: No JVD. No bruits. No lymphadenopathy. No thyromegaly. No thyroid nodules palpated.  LUNGS: Clear to auscultation. No use of accessory muscles to breathe. No rhonchi, rales or wheeze heard.  CARDIOVASCULAR SYSTEM: S1, S2 normal. No gallops, rubs or murmurs heard. Carotid upstroke 2+ bilaterally. No bruits. Dorsalis pedis pulses 2+ bilaterally. No edema of the lower extremity.  ABDOMEN:  Soft. Tenderness throughout the upper and lower abdomen. No organomegaly/splenomegaly. Normoactive bowel sounds. No masses felt.  LYMPHATIC: No lymph nodes in the neck.  MUSCULOSKELETAL: No clubbing, edema or cyanosis.  SKIN: No rashes or ulcers seen.  NEUROLOGIC: Cranial nerves II through XII grossly intact.  PSYCHIATRIC: The patient is alert and oriented to person, place and time.   LABORATORY AND RADIOLOGICAL DATA:  Urinalysis trace ketones, 1+ blood,  otherwise negative. White blood cell count 11.2, H and H 13.3 and 40.1, platelet count 179. Lipase 120, glucose 95, BUN 18, creatinine 0.76, sodium 138, potassium 3.6, chloride 109, CO2  24, calcium 8.6. Liver function tests normal range.   ASSESSMENT AND PLAN: 1.  Nausea, vomiting, diarrhea and abdominal pain, likely a viral gastroenteritis. Could also be a bacterial gastroenteritis or even a food poisoning. Will give conservative management with IV fluid hydration, p.r.n. nausea medications, p.r.n. pain medications. We will send off stool studies if any further diarrhea and continue to monitor closely.  2.  Hypothyroidism. Continue Armour Thyroid.  3.  Depression and anxiety. On fluoxetine and alprazolam.  4.  Vitamin D deficiency. Can continue vitamin D as outpatient.  5.  B12 deficiency. On monthly IM B12 shots, can continue as outpatient.   TIME SPENT ON ADMISSION: 55 minutes.    ____________________________ Tana Conch. Leslye Peer, MD rjw:cs D: 03/20/2013 16:10:96 ET T: 03/20/2013 20:53:54 ET JOB#: [id number]  cc: Tana Conch. Leslye Peer, MD, <Dictator> Tama High III, MD Marisue Brooklyn MD ELECTRONICALLY SIGNED 03/27/2013 13:27

## 2014-12-11 ENCOUNTER — Other Ambulatory Visit: Payer: Self-pay | Admitting: Obstetrics and Gynecology

## 2014-12-11 DIAGNOSIS — Z1231 Encounter for screening mammogram for malignant neoplasm of breast: Secondary | ICD-10-CM

## 2014-12-17 ENCOUNTER — Ambulatory Visit: Payer: Self-pay | Admitting: Physician Assistant

## 2014-12-17 ENCOUNTER — Encounter: Payer: Self-pay | Admitting: Physician Assistant

## 2014-12-17 VITALS — BP 120/70 | HR 71 | Temp 97.9°F

## 2014-12-17 DIAGNOSIS — J069 Acute upper respiratory infection, unspecified: Secondary | ICD-10-CM

## 2014-12-17 DIAGNOSIS — B379 Candidiasis, unspecified: Secondary | ICD-10-CM

## 2014-12-17 MED ORDER — FLUCONAZOLE 150 MG PO TABS: 150.0000 mg | ORAL_TABLET | Freq: Once | ORAL | Status: DC

## 2014-12-17 MED ORDER — FLUTICASONE PROPIONATE 50 MCG/ACT NA SUSP: 2.0000 | Freq: Every day | NASAL | Status: DC

## 2014-12-17 MED ORDER — CEFDINIR 300 MG PO CAPS: 300.0000 mg | ORAL_CAPSULE | Freq: Two times a day (BID) | ORAL | Status: DC

## 2014-12-17 NOTE — Progress Notes (Signed)
S: C/o runny nose and congestion for 3 days, no fever, chills, cp/sob, v/d; mucus is green and thick, cough is sporadic, c/o of facial and dental pain.   Using otc meds:   O: PE: perrl eomi, normocephalic, tms dull, nasal mucosa red and swollen, throat injected, neck supple no lymph, lungs c t a, cv rrr, neuro intact  A:  Acute sinusitis, acute uri   P: omnicef 300mg  bid, dilfucan, flonase;  drink fluids, continue regular meds , use otc meds of choice, return if not improving in 5 days, return earlier if worsening

## 2014-12-25 ENCOUNTER — Ambulatory Visit
Admission: RE | Admit: 2014-12-25 | Discharge: 2014-12-25 | Disposition: A | Discharge status: Home / Self Care | Payer: PRIVATE HEALTH INSURANCE | Source: Ambulatory Visit | Attending: Obstetrics and Gynecology | Admitting: Obstetrics and Gynecology

## 2014-12-25 DIAGNOSIS — Z1231 Encounter for screening mammogram for malignant neoplasm of breast: Secondary | ICD-10-CM | POA: Insufficient documentation

## 2015-02-21 ENCOUNTER — Other Ambulatory Visit: Payer: Self-pay | Admitting: Emergency Medicine

## 2015-02-21 NOTE — Telephone Encounter (Signed)
Received a faxed medication request from [employer].  Please advise.  Thank you.

## 2015-05-27 ENCOUNTER — Ambulatory Visit: Payer: Self-pay | Admitting: Registered Nurse

## 2015-05-27 VITALS — BP 120/65 | HR 66 | Temp 97.6°F

## 2015-05-27 DIAGNOSIS — E039 Hypothyroidism, unspecified: Secondary | ICD-10-CM | POA: Insufficient documentation

## 2015-05-27 DIAGNOSIS — M8580 Other specified disorders of bone density and structure, unspecified site: Secondary | ICD-10-CM | POA: Insufficient documentation

## 2015-05-27 DIAGNOSIS — J0101 Acute recurrent maxillary sinusitis: Secondary | ICD-10-CM

## 2015-05-27 DIAGNOSIS — E785 Hyperlipidemia, unspecified: Secondary | ICD-10-CM | POA: Insufficient documentation

## 2015-05-27 DIAGNOSIS — G4730 Sleep apnea, unspecified: Secondary | ICD-10-CM | POA: Insufficient documentation

## 2015-05-27 DIAGNOSIS — J302 Other seasonal allergic rhinitis: Secondary | ICD-10-CM

## 2015-05-27 DIAGNOSIS — H3552 Pigmentary retinal dystrophy: Secondary | ICD-10-CM | POA: Insufficient documentation

## 2015-05-27 DIAGNOSIS — B029 Zoster without complications: Secondary | ICD-10-CM | POA: Insufficient documentation

## 2015-05-27 DIAGNOSIS — H6593 Unspecified nonsuppurative otitis media, bilateral: Secondary | ICD-10-CM

## 2015-05-27 MED ORDER — SALINE SPRAY 0.65 % NA SOLN: 1.0000 | NASAL | Status: DC | PRN

## 2015-05-27 MED ORDER — ACETAMINOPHEN 500 MG PO TABS: 1000.0000 mg | ORAL_TABLET | Freq: Four times a day (QID) | ORAL | Status: AC | PRN

## 2015-05-27 MED ORDER — FLUTICASONE PROPIONATE 50 MCG/ACT NA SUSP: 1.0000 | Freq: Two times a day (BID) | NASAL | Status: DC

## 2015-05-27 MED ORDER — AMOXICILLIN-POT CLAVULANATE 400-57 MG PO CHEW: 2.0000 | CHEWABLE_TABLET | Freq: Two times a day (BID) | ORAL | Status: AC

## 2015-05-27 NOTE — Progress Notes (Signed)
Subjective:    Patient ID: Brenda Oneill, female    DOB: 1955-07-24, 60 y.o.   MRN: [id number]  HPI Comments: Married caucasian female here for evaluation right ear/throat pain, post nasal drip, sinus congestion, headache; fever yesterday took advil helped some last dose last night; seasonal allergies on allegra D po daily ran out of flonase needs refill.  Reported sinus infection in past year  Sore Throat  This is a new problem. The current episode started in the past 7 days. The problem has been gradually worsening. The pain is worse on the right side. The maximum temperature recorded prior to her arrival was 100.4 - 100.9 F. The fever has been present for less than 1 day. The pain is at a severity of 5/10. The pain is moderate. Associated symptoms include congestion, ear pain, headaches and a plugged ear sensation. Pertinent negatives include no abdominal pain, coughing, diarrhea, drooling, ear discharge, hoarse voice, neck pain, shortness of breath, stridor, swollen glands, trouble swallowing or vomiting. She has had exposure to strep. She has had no exposure to mono. She has tried NSAIDs and cool liquids for the symptoms. The treatment provided mild relief.  Sinusitis Associated symptoms include congestion, ear pain, headaches, sinus pressure and a sore throat. Pertinent negatives include no chills, coughing, diaphoresis, hoarse voice, neck pain, shortness of breath, sneezing or swollen glands.      Review of Systems  Constitutional: Positive for fever. Negative for chills, diaphoresis, activity change, appetite change, fatigue and unexpected weight change.  HENT: Positive for congestion, ear pain, postnasal drip, rhinorrhea, sinus pressure and sore throat. Negative for dental problem, drooling, ear discharge, facial swelling, hearing loss, hoarse voice, mouth sores, nosebleeds, sneezing, tinnitus, trouble swallowing and voice change.   Eyes: Negative for photophobia, pain, discharge,  redness, itching and visual disturbance.  Respiratory: Negative for cough, choking, chest tightness, shortness of breath, wheezing and stridor.   Cardiovascular: Negative for chest pain, palpitations and leg swelling.  Gastrointestinal: Negative for nausea, vomiting, abdominal pain, diarrhea, constipation, blood in stool and abdominal distention.  Endocrine: Negative for cold intolerance and heat intolerance.  Genitourinary: Negative for dysuria, hematuria and difficulty urinating.  Musculoskeletal: Negative for myalgias, back pain, joint swelling, arthralgias, gait problem, neck pain and neck stiffness.  Skin: Negative for color change, pallor, rash and wound.  Allergic/Immunologic: Positive for environmental allergies. Negative for food allergies.  Neurological: Positive for headaches. Negative for dizziness, tremors, seizures, syncope, facial asymmetry, speech difficulty, weakness, light-headedness and numbness.  Hematological: Negative for adenopathy. Does not bruise/bleed easily.  Psychiatric/Behavioral: Negative for behavioral problems, confusion, sleep disturbance and agitation.       Objective:   Physical Exam  Constitutional: She is oriented to person, place, and time. She appears well-developed and well-nourished. She is active and cooperative.  Non-toxic appearance. She does not have a sickly appearance. She appears ill. No distress.  HENT:  Head: Normocephalic and atraumatic.  Right Ear: Hearing, external ear and ear canal normal. A middle ear effusion is present.  Left Ear: Hearing, external ear and ear canal normal. A middle ear effusion is present.  Nose: Mucosal edema and rhinorrhea present. No nose lacerations, sinus tenderness, nasal deformity, septal deviation or nasal septal hematoma. No epistaxis.  No foreign bodies. Right sinus exhibits maxillary sinus tenderness and frontal sinus tenderness. Left sinus exhibits maxillary sinus tenderness and frontal sinus tenderness.   Mouth/Throat: Uvula is midline and mucous membranes are normal. Mucous membranes are not pale, not dry and not cyanotic.  She does not have dentures. No oral lesions. No trismus in the jaw. Normal dentition. No dental abscesses, uvula swelling, lacerations or dental caries. Posterior oropharyngeal edema and posterior oropharyngeal erythema present. No oropharyngeal exudate or tonsillar abscesses.  Cobblestoning posterior pharynx; oropharynx with macular erythema; tonsils 1+/4 bilaterally edema/erythema; bilateral nasal turbinates edema/erythema; bilateral TMs with air fluid level clear; bilateral allergic shiners  Eyes: Conjunctivae, EOM and lids are normal. Pupils are equal, round, and reactive to light. Right eye exhibits no chemosis, no discharge, no exudate and no hordeolum. No foreign body present in the right eye. Left eye exhibits no chemosis, no discharge, no exudate and no hordeolum. No foreign body present in the left eye. Right conjunctiva is not injected. Right conjunctiva has no hemorrhage. Left conjunctiva is not injected. Left conjunctiva has no hemorrhage. No scleral icterus. Right eye exhibits normal extraocular motion and no nystagmus. Left eye exhibits normal extraocular motion and no nystagmus. Right pupil is round and reactive. Left pupil is round and reactive. Pupils are equal.  Neck: Trachea normal and normal range of motion. Neck supple. No tracheal tenderness, no spinous process tenderness and no muscular tenderness present. No rigidity. No tracheal deviation, no edema, no erythema and normal range of motion present. No thyroid mass and no thyromegaly present.  Cardiovascular: Normal rate, regular rhythm, S1 normal, S2 normal, normal heart sounds and intact distal pulses.  PMI is not displaced.  Exam reveals no gallop and no friction rub.   No murmur heard. Pulmonary/Chest: Effort normal and breath sounds normal. No accessory muscle usage or stridor. No respiratory distress. She has  no decreased breath sounds. She has no wheezes. She has no rhonchi. She has no rales. She exhibits no tenderness.  Abdominal: Soft. She exhibits no distension.  Musculoskeletal: Normal range of motion. She exhibits no edema or tenderness.       Right shoulder: Normal.       Left shoulder: Normal.       Right hip: Normal.       Left hip: Normal.       Right knee: Normal.       Left knee: Normal.       Cervical back: Normal.       Right hand: Normal.       Left hand: Normal.  Lymphadenopathy:       Head (right side): No submental, no submandibular, no tonsillar, no preauricular, no posterior auricular and no occipital adenopathy present.       Head (left side): No submental, no submandibular, no tonsillar, no preauricular, no posterior auricular and no occipital adenopathy present.    She has no cervical adenopathy.       Right cervical: No superficial cervical, no deep cervical and no posterior cervical adenopathy present.      Left cervical: No superficial cervical, no deep cervical and no posterior cervical adenopathy present.  Neurological: She is alert and oriented to person, place, and time. She has normal strength. She is not disoriented. She displays no atrophy and no tremor. No cranial nerve deficit or sensory deficit. She exhibits normal muscle tone. She displays no seizure activity. Coordination and gait normal. GCS eye subscore is 4. GCS verbal subscore is 5. GCS motor subscore is 6.  Skin: Skin is warm, dry and intact. No abrasion, no bruising, no burn, no ecchymosis, no laceration, no lesion, no petechiae and no rash noted. She is not diaphoretic. No cyanosis or erythema. No pallor. Nails show no clubbing.  Psychiatric:  She has a normal mood and affect. Her speech is normal and behavior is normal. Judgment and thought content normal. Cognition and memory are normal.  Nursing note and vitals reviewed.         Assessment & Plan:  A-acute recurrent maxillary and frontal  sinusitis, bilateral otitis media with effusion; seasonal allergic rhinitis  P-Supportive treatment.   No evidence of invasive bacterial infection, non toxic and well hydrated.  This is most likely self limiting viral infection.  I do not see where any further testing or imaging is necessary at this time.   I will suggest supportive care, rest, good hygiene and encourage the patient to take adequate fluids.  The patient is to return to clinic or EMERGENCY ROOM if symptoms worsen or change significantly e.g. ear pain, fever, purulent discharge from ears or bleeding.  Patient verbalized agreement and understanding of treatment plan.    Post nasal drip from sinusitis and seasonal allergies.  I do not see where any further testing or imaging is necessary at this time. 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  Since treating sinusitis with augmentin also covers for strep.  Patient did not want testing today.  Suggest supportive care, rest, good hygiene and encourage the patient to take adequate fluids.  Does not require work excuse.continue allegra D po daily; add flonase 1 spray each nostril BID prn, nasal saline 1-2 sprays each nostril prn q2h, tylenol 1000mg  po QID; if no relief may add advil 800mg  po TID prn but discussed with patient could interact with her fluoxetine hyponatremia, bleeding/bruising, SIADH.  Discussed honey with lemon and salt water gargles for comfort also.  The patient is to return to clinic or EMERGENCY ROOM if symptoms worsen or change significantly e.g. fever, lethargy, SOB, wheezing.  Patient verbalized agreement and understanding of treatment plan.    Patient may use normal saline nasal spray as needed.  Continue allegra d po daily.  Restart flonase 1 spray each nostril BID.  Avoid triggers if possible.  Shower prior to bedtime if exposed to triggers.  If allergic dust/dust mites recommend mattress/pillow covers/encasements; washing  linens, vacuuming, sweeping, dusting weekly.  Call or return to clinic as needed if these symptoms worsen or fail to improve as anticipated.   Exitcare handout on allergic rhinitis given to patient.  Patient verbalized understanding of instructions, agreed with plan of care and had no further questions at this time.  P2:  Avoidance and hand washing.  Restart flonase 1 spray each nostril BID, saline 2 sprays each nostril q2h prn congestion.  start augmentin 875mg  po BID x 10 days.  Chewables as patient difficulty swallowing large pills.  Rx given.  No evidence of systemic bacterial infection, non toxic and well hydrated.  I do not see where any further testing or imaging is necessary at this time.   I will suggest supportive care, rest, good hygiene and encourage the patient to take adequate fluids.  The patient is to return to clinic or EMERGENCY ROOM if symptoms worsen or change significantly.  Patient verbalized agreement and understanding of treatment plan and had no further questions at this time.   P2:  Hand washing and cover cough

## 2015-11-19 ENCOUNTER — Ambulatory Visit: Payer: Self-pay | Admitting: Physician Assistant

## 2015-11-19 ENCOUNTER — Encounter: Payer: Self-pay | Admitting: Physician Assistant

## 2015-11-19 VITALS — BP 119/70 | HR 77 | Temp 98.3°F

## 2015-11-19 DIAGNOSIS — J0100 Acute maxillary sinusitis, unspecified: Secondary | ICD-10-CM

## 2015-11-19 MED ORDER — FEXOFENADINE-PSEUDOEPHED ER 180-240 MG PO TB24: 1.0000 | ORAL_TABLET | Freq: Every day | ORAL | 12 refills | Status: DC

## 2015-11-19 MED ORDER — AMOXICILLIN 875 MG PO TABS: 875.0000 mg | ORAL_TABLET | Freq: Two times a day (BID) | ORAL | 0 refills | Status: DC

## 2015-11-19 MED ORDER — PREDNISONE 10 MG PO TABS: 30.0000 mg | ORAL_TABLET | Freq: Every day | ORAL | 0 refills | Status: DC

## 2015-11-19 NOTE — Progress Notes (Signed)
S: C/o runny nose and congestion for 7 days, no fever, chills, cp/sob, v/d; mucus is green and thick in the mornings, clear the rest of the day, has allergies but thinks its turned into infection, has bad taste in her mouth, c/o of facial pain across forehead.   Using otc meds:   O: PE: perrl eomi, normocephalic, tms dull, nasal mucosa red and swollen, throat injected, neck supple no lymph, lungs c t a, cv rrr, neuro intact  A:  Acute sinusitis   P: drink fluids, continue regular meds , use otc meds of choice, return if not improving in 5 days, return earlier if worsening , prednisone 30mg  qd x 3d, amoxil, allegra -d 24 h

## 2015-12-02 DIAGNOSIS — D229 Melanocytic nevi, unspecified: Secondary | ICD-10-CM

## 2015-12-02 HISTORY — DX: Melanocytic nevi, unspecified: D22.9

## 2015-12-23 ENCOUNTER — Other Ambulatory Visit: Payer: Self-pay | Admitting: Obstetrics and Gynecology

## 2015-12-23 DIAGNOSIS — Z1231 Encounter for screening mammogram for malignant neoplasm of breast: Secondary | ICD-10-CM

## 2016-01-30 ENCOUNTER — Ambulatory Visit
Admission: RE | Admit: 2016-01-30 | Discharge: 2016-01-30 | Disposition: A | Discharge status: Home / Self Care | Payer: Managed Care, Other (non HMO) | Source: Ambulatory Visit | Attending: Obstetrics and Gynecology | Admitting: Obstetrics and Gynecology

## 2016-01-30 ENCOUNTER — Ambulatory Visit: Payer: PRIVATE HEALTH INSURANCE

## 2016-01-30 DIAGNOSIS — Z1231 Encounter for screening mammogram for malignant neoplasm of breast: Secondary | ICD-10-CM | POA: Diagnosis not present

## 2016-03-13 ENCOUNTER — Encounter
Admission: EM | Disposition: A | Discharge status: Home / Self Care | Payer: Self-pay | Source: Home / Self Care | Attending: Internal Medicine

## 2016-03-13 ENCOUNTER — Emergency Department: Payer: Managed Care, Other (non HMO)

## 2016-03-13 ENCOUNTER — Encounter: Payer: Self-pay | Admitting: Emergency Medicine

## 2016-03-13 ENCOUNTER — Inpatient Hospital Stay
Admission: EM | Admit: 2016-03-13 | Discharge: 2016-03-14 | DRG: 197 | Disposition: A | Discharge status: Home / Self Care | Payer: Managed Care, Other (non HMO) | Attending: Internal Medicine | Admitting: Internal Medicine

## 2016-03-13 DIAGNOSIS — Z79899 Other long term (current) drug therapy: Secondary | ICD-10-CM

## 2016-03-13 DIAGNOSIS — R739 Hyperglycemia, unspecified: Secondary | ICD-10-CM | POA: Diagnosis present

## 2016-03-13 DIAGNOSIS — I248 Other forms of acute ischemic heart disease: Secondary | ICD-10-CM | POA: Diagnosis present

## 2016-03-13 DIAGNOSIS — J189 Pneumonia, unspecified organism: Secondary | ICD-10-CM

## 2016-03-13 DIAGNOSIS — J849 Interstitial pulmonary disease, unspecified: Principal | ICD-10-CM | POA: Diagnosis present

## 2016-03-13 DIAGNOSIS — E063 Autoimmune thyroiditis: Secondary | ICD-10-CM | POA: Diagnosis present

## 2016-03-13 DIAGNOSIS — D72829 Elevated white blood cell count, unspecified: Secondary | ICD-10-CM

## 2016-03-13 DIAGNOSIS — R030 Elevated blood-pressure reading, without diagnosis of hypertension: Secondary | ICD-10-CM

## 2016-03-13 DIAGNOSIS — M542 Cervicalgia: Secondary | ICD-10-CM | POA: Diagnosis present

## 2016-03-13 DIAGNOSIS — I214 Non-ST elevation (NSTEMI) myocardial infarction: Secondary | ICD-10-CM

## 2016-03-13 DIAGNOSIS — F419 Anxiety disorder, unspecified: Secondary | ICD-10-CM | POA: Diagnosis present

## 2016-03-13 DIAGNOSIS — R110 Nausea: Secondary | ICD-10-CM

## 2016-03-13 DIAGNOSIS — R079 Chest pain, unspecified: Secondary | ICD-10-CM | POA: Diagnosis not present

## 2016-03-13 DIAGNOSIS — R7989 Other specified abnormal findings of blood chemistry: Secondary | ICD-10-CM

## 2016-03-13 DIAGNOSIS — G4733 Obstructive sleep apnea (adult) (pediatric): Secondary | ICD-10-CM | POA: Diagnosis present

## 2016-03-13 DIAGNOSIS — F329 Major depressive disorder, single episode, unspecified: Secondary | ICD-10-CM | POA: Diagnosis present

## 2016-03-13 DIAGNOSIS — R778 Other specified abnormalities of plasma proteins: Secondary | ICD-10-CM

## 2016-03-13 HISTORY — PX: LEFT HEART CATH AND CORONARY ANGIOGRAPHY: CATH118249

## 2016-03-13 HISTORY — DX: Autoimmune thyroiditis: E06.3

## 2016-03-13 HISTORY — DX: Deficiency of other specified B group vitamins: E53.8

## 2016-03-13 HISTORY — DX: Pigmentary retinal dystrophy: H35.52

## 2016-03-13 HISTORY — DX: Depression, unspecified: F32.A

## 2016-03-13 HISTORY — DX: Major depressive disorder, single episode, unspecified: F32.9

## 2016-03-13 HISTORY — DX: Vitamin D deficiency, unspecified: E55.9

## 2016-03-13 HISTORY — DX: Vitamin B12 deficiency anemia due to intrinsic factor deficiency: D51.0

## 2016-03-13 HISTORY — DX: Acute vulvitis: N76.2

## 2016-03-13 HISTORY — DX: Other specified disorders of bone density and structure, unspecified site: M85.80

## 2016-03-13 HISTORY — DX: Syncope and collapse: R55

## 2016-03-13 HISTORY — DX: Anxiety disorder, unspecified: F41.9

## 2016-03-13 HISTORY — DX: Obstructive sleep apnea (adult) (pediatric): G47.33

## 2016-03-13 HISTORY — DX: Hypothyroidism, unspecified: E03.9

## 2016-03-13 HISTORY — DX: Overactive bladder: N32.81

## 2016-03-13 HISTORY — DX: Zoster without complications: B02.9

## 2016-03-13 HISTORY — DX: Herpesviral infection, unspecified: B00.9

## 2016-03-13 LAB — BASIC METABOLIC PANEL
ANION GAP: 8 (ref 5–15)
BUN: 14 mg/dL (ref 6–20)
CALCIUM: 9.1 mg/dL (ref 8.9–10.3)
CO2: 25 mmol/L (ref 22–32)
CREATININE: 0.68 mg/dL (ref 0.44–1.00)
Chloride: 109 mmol/L (ref 101–111)
GFR calc non Af Amer: >60 mL/min (ref >60)
Glucose, Bld: 100 mg/dL — ABNORMAL HIGH (ref 65–99)
Potassium: 3.9 mmol/L (ref 3.5–5.1)
SODIUM: 142 mmol/L (ref 135–145)

## 2016-03-13 LAB — CBC
HCT: 39.4 % (ref 35.0–47.0)
HEMOGLOBIN: 13.5 g/dL (ref 12.0–16.0)
MCH: 30.1 pg (ref 26.0–34.0)
MCHC: 34.2 g/dL (ref 32.0–36.0)
MCV: 88 fL (ref 80.0–100.0)
PLATELETS: 235 10*3/uL (ref 150–440)
RBC: 4.48 MIL/uL (ref 3.80–5.20)
RDW: 13.3 % (ref 11.5–14.5)
WBC: 9.8 10*3/uL (ref 3.6–11.0)

## 2016-03-13 LAB — PROTIME-INR
INR: 0.98
PROTHROMBIN TIME: 13 s (ref 11.4–15.2)

## 2016-03-13 LAB — TROPONIN I
TROPONIN I: 0.09 ng/mL — AB (ref <0.03)
Troponin I: 0.14 ng/mL (ref <0.03)

## 2016-03-13 LAB — APTT: APTT: 32 s (ref 24–36)

## 2016-03-13 SURGERY — LEFT HEART CATH AND CORONARY ANGIOGRAPHY
Anesthesia: Choice

## 2016-03-13 MED ORDER — FLUOXETINE HCL 20 MG PO CAPS
40.0000 mg | ORAL_CAPSULE | Freq: Every day | ORAL | Status: DC
  Administered 2016-03-14: 40 mg via ORAL
  Filled 2016-03-13: qty 2

## 2016-03-13 MED ORDER — IOPAMIDOL (ISOVUE-300) INJECTION 61%
INTRAVENOUS | Status: DC | PRN
  Administered 2016-03-13: 70 mL via INTRA_ARTERIAL

## 2016-03-13 MED ORDER — ACETAMINOPHEN 325 MG PO TABS: 650.0000 mg | ORAL_TABLET | Freq: Four times a day (QID) | ORAL | Status: DC | PRN

## 2016-03-13 MED ORDER — MIDAZOLAM HCL 2 MG/2ML IJ SOLN
INTRAMUSCULAR | Status: DC | PRN
  Administered 2016-03-13: 1 mg via INTRAVENOUS

## 2016-03-13 MED ORDER — ONDANSETRON HCL 4 MG/2ML IJ SOLN: 4.0000 mg | Freq: Four times a day (QID) | INTRAMUSCULAR | Status: DC | PRN

## 2016-03-13 MED ORDER — THYROID 30 MG PO TABS
30.0000 mg | ORAL_TABLET | Freq: Every day | ORAL | Status: DC
  Administered 2016-03-14: 30 mg via ORAL
  Filled 2016-03-13: qty 1

## 2016-03-13 MED ORDER — FENTANYL CITRATE (PF) 100 MCG/2ML IJ SOLN
INTRAMUSCULAR | Status: DC | PRN
  Administered 2016-03-13: 50 ug via INTRAVENOUS

## 2016-03-13 MED ORDER — SODIUM CHLORIDE 0.9% FLUSH: 3.0000 mL | INTRAVENOUS | Status: DC | PRN

## 2016-03-13 MED ORDER — SODIUM CHLORIDE 0.9% FLUSH
3.0000 mL | Freq: Two times a day (BID) | INTRAVENOUS | Status: DC
  Administered 2016-03-14: 3 mL via INTRAVENOUS

## 2016-03-13 MED ORDER — ALPRAZOLAM 0.25 MG PO TABS
0.2500 mg | ORAL_TABLET | Freq: Every day | ORAL | Status: DC
  Administered 2016-03-13 – 2016-03-14 (×2): 0.25 mg via ORAL
  Filled 2016-03-13 (×2): qty 1

## 2016-03-13 MED ORDER — HYDROCODONE-ACETAMINOPHEN 5-325 MG PO TABS
1.0000 | ORAL_TABLET | ORAL | Status: DC | PRN
  Administered 2016-03-13 – 2016-03-14 (×4): 1 via ORAL
  Filled 2016-03-13 (×3): qty 1

## 2016-03-13 MED ORDER — SODIUM CHLORIDE 0.9 % IV SOLN
INTRAVENOUS | Status: DC
  Administered 2016-03-13 – 2016-03-14 (×2): via INTRAVENOUS

## 2016-03-13 MED ORDER — ASPIRIN EC 325 MG PO TBEC
325.0000 mg | DELAYED_RELEASE_TABLET | Freq: Every day | ORAL | Status: DC
  Administered 2016-03-14: 325 mg via ORAL
  Filled 2016-03-13: qty 1

## 2016-03-13 MED ORDER — VERAPAMIL HCL 2.5 MG/ML IV SOLN
INTRAVENOUS | Status: DC | PRN
Start: 2016-03-13 — End: 2016-03-13
  Administered 2016-03-13: 2.5 mg via INTRA_ARTERIAL

## 2016-03-13 MED ORDER — METOPROLOL TARTRATE 25 MG PO TABS
25.0000 mg | ORAL_TABLET | Freq: Two times a day (BID) | ORAL | Status: DC
  Administered 2016-03-14: 25 mg via ORAL
  Filled 2016-03-13 (×2): qty 1

## 2016-03-13 MED ORDER — ACETAMINOPHEN 650 MG RE SUPP
650.0000 mg | Freq: Four times a day (QID) | RECTAL | Status: DC | PRN
  Filled 2016-03-13: qty 1

## 2016-03-13 MED ORDER — SODIUM CHLORIDE 0.9% FLUSH
3.0000 mL | Freq: Two times a day (BID) | INTRAVENOUS | Status: DC
  Administered 2016-03-13: 3 mL via INTRAVENOUS

## 2016-03-13 MED ORDER — SODIUM CHLORIDE 0.9% FLUSH: 3.0000 mL | Freq: Two times a day (BID) | INTRAVENOUS | Status: DC

## 2016-03-13 MED ORDER — HYDROCODONE-ACETAMINOPHEN 5-325 MG PO TABS
ORAL_TABLET | ORAL | Status: AC
  Filled 2016-03-13: qty 1

## 2016-03-13 MED ORDER — SODIUM CHLORIDE 0.9 % WEIGHT BASED INFUSION: 1.0000 mL/kg/h | INTRAVENOUS | Status: AC

## 2016-03-13 MED ORDER — VITAMIN D (ERGOCALCIFEROL) 1.25 MG (50000 UNIT) PO CAPS: 50000.0000 [IU] | ORAL_CAPSULE | ORAL | Status: DC

## 2016-03-13 MED ORDER — ASPIRIN 81 MG PO CHEW
162.0000 mg | CHEWABLE_TABLET | Freq: Once | ORAL | Status: AC
  Administered 2016-03-13: 162 mg via ORAL
  Filled 2016-03-13: qty 2

## 2016-03-13 MED ORDER — ONDANSETRON HCL 4 MG PO TABS
4.0000 mg | ORAL_TABLET | Freq: Four times a day (QID) | ORAL | Status: DC | PRN
  Filled 2016-03-13: qty 1

## 2016-03-13 MED ORDER — HEPARIN SODIUM (PORCINE) 1000 UNIT/ML IJ SOLN
INTRAMUSCULAR | Status: DC | PRN
  Administered 2016-03-13: 3500 [IU] via INTRAVENOUS

## 2016-03-13 MED ORDER — HEPARIN (PORCINE) IN NACL 100-0.45 UNIT/ML-% IJ SOLN
850.0000 [IU]/h | INTRAMUSCULAR | Status: DC
  Administered 2016-03-13: 850 [IU]/h via INTRAVENOUS
  Filled 2016-03-13: qty 250

## 2016-03-13 MED ORDER — HEPARIN BOLUS VIA INFUSION
4000.0000 [IU] | Freq: Once | INTRAVENOUS | Status: AC
  Administered 2016-03-13: 4000 [IU] via INTRAVENOUS
  Filled 2016-03-13: qty 4000

## 2016-03-13 MED ORDER — ACYCLOVIR 200 MG PO CAPS
200.0000 mg | ORAL_CAPSULE | Freq: Every day | ORAL | Status: DC
  Administered 2016-03-14: 200 mg via ORAL
  Filled 2016-03-13: qty 1

## 2016-03-13 MED ORDER — ATORVASTATIN CALCIUM 20 MG PO TABS: 40.0000 mg | ORAL_TABLET | Freq: Every day | ORAL | Status: DC

## 2016-03-13 MED ORDER — SODIUM CHLORIDE 0.9 % IV SOLN: 250.0000 mL | INTRAVENOUS | Status: DC | PRN

## 2016-03-13 MED ORDER — VERAPAMIL HCL 2.5 MG/ML IV SOLN
INTRAVENOUS | Status: AC
  Filled 2016-03-13: qty 2

## 2016-03-13 MED ORDER — SODIUM CHLORIDE 0.9 % WEIGHT BASED INFUSION: 1.0000 mL/kg/h | INTRAVENOUS | Status: DC

## 2016-03-13 MED ORDER — SODIUM CHLORIDE 0.9 % WEIGHT BASED INFUSION: 3.0000 mL/kg/h | INTRAVENOUS | Status: DC

## 2016-03-13 MED ORDER — HEPARIN (PORCINE) IN NACL 2-0.9 UNIT/ML-% IJ SOLN
INTRAMUSCULAR | Status: AC
  Filled 2016-03-13: qty 1000

## 2016-03-13 SURGICAL SUPPLY — 6 items
CATH OPTITORQUE JACKY 4.0 5F (CATHETERS) ×2 IMPLANT
DEVICE RAD TR BAND REGULAR (VASCULAR PRODUCTS) ×2 IMPLANT
GLIDESHEATH SLEND SS 6F .021 (SHEATH) ×2 IMPLANT
KIT MANI 3VAL PERCEP (MISCELLANEOUS) ×2 IMPLANT
PACK CARDIAC CATH (CUSTOM PROCEDURE TRAY) ×2 IMPLANT
WIRE ROSEN-J .035X260CM (WIRE) ×2 IMPLANT

## 2016-03-13 NOTE — ED Triage Notes (Signed)
Pt ambulatory to stat desk with back and chest pain. Pt placed in wheelchair.

## 2016-03-13 NOTE — H&P (Signed)
Brenda Oneill    MR#:  [id number]  DATE OF BIRTH:  1955/05/04  DATE OF ADMISSION:  03/13/2016  PRIMARY CARE PHYSICIAN: No PCP Per Patient   REQUESTING/REFERRING PHYSICIAN: Dr. Larae Grooms  CHIEF COMPLAINT:   Chief Complaint  Patient presents with  . Chest Pain    HISTORY OF PRESENT ILLNESS:  Brenda Oneill  is a 61 y.o. female with a known history of Hashimoto's thyroiditis on thyroid supplements, retinitis, vitamin D deficiency presents to hospital secondary to worsening chest pain associated with nausea. Patient describes her symptoms started about 3 days ago with neck pain that has been radiating to between her shoulder blades and down her arms. Since yesterday she noticed reflux kind of chest pain that improved initially and then she started having chest pain since last night that did not feel like reflux pain. She's been nauseous over the last week on and off, also woke up with sweats early this morning. She got very uncomfortable during the night and couldn't sleep at all. She has occasional palpitations and had a Holter that showed PACs and PVCs in the past. Denies any other complaints. No prior cardiac disease history otherwise. No strong family history of heart disease either. After aspirin in the emergency room, felt like her chest pain is relieved, also her hands feel less numb at this time. EKG with T-wave inversions in anterior leads and also first troponin is slightly elevated.  PAST MEDICAL HISTORY:   Past Medical History:  Diagnosis Date  . Hashimoto's thyroiditis   . Retinitis pigmentosa   . Thyroid disease   . Vitamin D deficiency     PAST SURGICAL HISTORY:   Past Surgical History:  Procedure Laterality Date  . BREAST CYST ASPIRATION Right 2014  . CATARACT EXTRACTION    . CESAREAN SECTION    . TONSILLECTOMY      SOCIAL HISTORY:   Social History  Substance Use Topics  .  Smoking status: Never Smoker  . Smokeless tobacco: Never Used  . Alcohol use No    FAMILY HISTORY:   Family History  Problem Relation Age of Onset  . Breast cancer Maternal Aunt     great  . Diabetes Mother   . Lung cancer Father   . Hypertension Father     DRUG ALLERGIES:   Allergies  Allergen Reactions  . Nitrofurantoin Other (See Comments)  . Nitrofurantoin Macrocrystal Other (See Comments)    REVIEW OF SYSTEMS:   Review of Systems  Constitutional: Negative for chills, fever and malaise/fatigue.  HENT: Negative for ear discharge, ear pain, hearing loss and nosebleeds.   Eyes: Negative for blurred vision, double vision and photophobia.  Respiratory: Negative for cough, hemoptysis, shortness of breath and wheezing.   Cardiovascular: Positive for chest pain and palpitations. Negative for orthopnea and leg swelling.  Gastrointestinal: Positive for heartburn and nausea. Negative for abdominal pain, constipation, diarrhea, melena and vomiting.  Genitourinary: Negative for dysuria and urgency.  Musculoskeletal: Positive for back pain, myalgias and neck pain.  Skin: Negative for rash.  Neurological: Positive for headaches. Negative for dizziness, sensory change, speech change and focal weakness.  Endo/Heme/Allergies: Does not bruise/bleed easily.  Psychiatric/Behavioral: Negative for depression.    MEDICATIONS AT HOME:   Prior to Admission medications   Medication Sig Start Date End Date Taking? Authorizing Provider  acyclovir (ZOVIRAX) 200 MG capsule Take 200 mg by mouth daily.  09/21/14  Yes Historical Provider,  MD  ALPRAZolam (XANAX) 0.25 MG tablet Take 0.25 mg by mouth daily.  08/30/14  Yes Historical Provider, MD  betamethasone valerate (VALISONE) 0.1 % cream Apply 0.1 application topically daily. 07/05/14  Yes Historical Provider, MD  cyanocobalamin (,VITAMIN B-12,) 1000 MCG/ML injection Inject 1,000 mcg into the muscle every 30 (thirty) days. 03/21/15  Yes Historical  Provider, MD  Estriol Micronized POWD Place 30 g vaginally 2 (two) times a week.  12/06/14  Yes Historical Provider, MD  FLUoxetine (PROZAC) 40 MG capsule Take 40 mg by mouth daily.  10/24/14  Yes Historical Provider, MD  thyroid (ARMOUR THYROID) 30 MG tablet Take by mouth.   Yes Historical Provider, MD  Vitamin D, Ergocalciferol, (DRISDOL) 50000 units CAPS capsule Take 50,000 Units by mouth once a week. 02/22/15  Yes Historical Provider, MD  fexofenadine-pseudoephedrine (ALLEGRA-D 24) 180-240 MG 24 hr tablet Take 1 tablet by mouth daily. Patient not taking: Reported on 03/13/2016 11/19/15   Versie Starks, PA-C  fluticasone [facility]) 50 MCG/ACT nasal spray Place 1 spray into both nostrils 2 (two) times daily. 05/27/15 06/26/15  Olen Cordial, NP  predniSONE (DELTASONE) 10 MG tablet Take 3 tablets (30 mg total) by mouth daily with breakfast. Patient not taking: Reported on 03/13/2016 11/19/15   Versie Starks, PA-C  sodium chloride (OCEAN) 0.65 % SOLN nasal spray Place 1 spray into both nostrils as needed for congestion. Patient not taking: Reported on 03/13/2016 05/27/15   Olen Cordial, NP      VITAL SIGNS:  Blood pressure (!) 146/85, pulse 66, temperature 97.7 F (36.5 C), temperature source Oral, resp. rate 18, height 5\' 4"  (1.626 m), weight 69.4 kg (153 lb), SpO2 98 %.  PHYSICAL EXAMINATION:   Physical Exam  GENERAL:  61 y.o.-year-old patient lying in the bed with no acute distress.  EYES: Pupils equal, round, reactive to light and accommodation. No scleral icterus. Extraocular muscles intact.  HEENT: Head atraumatic, normocephalic. Oropharynx and nasopharynx clear.  NECK:  Supple, no jugular venous distention. No thyroid enlargement, no tenderness.  LUNGS: Normal breath sounds bilaterally, no wheezing, rales,rhonchi or crepitation. No use of accessory muscles of respiration.  CARDIOVASCULAR: S1, S2 normal. No murmurs, rubs, or gallops.  ABDOMEN: Soft, nontender, nondistended. Bowel  sounds present. No organomegaly or mass.  EXTREMITIES: No pedal edema, cyanosis, or clubbing.  NEUROLOGIC: Cranial nerves II through XII are intact. Muscle strength 5/5 in all extremities. Sensation intact. Gait not checked.  PSYCHIATRIC: The patient is alert and oriented x 3.  SKIN: No obvious rash, lesion, or ulcer.   LABORATORY PANEL:   CBC  Recent Labs Lab 03/13/16 1157  WBC 9.8  HGB 13.5  HCT 39.4  PLT 235   ------------------------------------------------------------------------------------------------------------------  Chemistries   Recent Labs Lab 03/13/16 1157  NA 142  K 3.9  CL 109  CO2 25  GLUCOSE 100*  BUN 14  CREATININE 0.68  CALCIUM 9.1   ------------------------------------------------------------------------------------------------------------------  Cardiac Enzymes  Recent Labs Lab 03/13/16 1157  TROPONINI 0.09*   ------------------------------------------------------------------------------------------------------------------  RADIOLOGY:  Dg Chest 2 View  Result Date: 03/13/2016 CLINICAL DATA:  Chest pain EXAM: CHEST  2 VIEW COMPARISON:  01/20/2012 FINDINGS: Biapical scarring. Lungs otherwise clear. Heart is normal size. No effusions or acute bony abnormality. IMPRESSION: Biapical scarring.  No active disease. Electronically Signed   By: Rolm Baptise M.D.   On: 03/13/2016 12:41    EKG:   Orders placed or performed during the hospital encounter of 03/13/16  . EKG 12-Lead  .  EKG 12-Lead  . ED EKG within 10 minutes  . ED EKG within 10 minutes  . ED EKG  . ED EKG    IMPRESSION AND PLAN:   Brenda Oneill  is a 61 y.o. female with a known history of Hashimoto's thyroiditis on thyroid supplements, retinitis, vitamin D deficiency presents to hospital secondary to worsening chest pain associated with nausea.  #1 NSTEMI- EKG changes and slightly elevated first troponin - admitted to tele, recycle troponins - started on heparin drip - asa,  metoprolol and statin started - Echocardiogram, cards consult -a1c and lipid panel ordered as well  #2 Hashimotos thyroiditis- follows with Dr.Solum Continue armour thyroid supplements  #3 Depression and anxiety- prozac and xanax  #4 DVT prophylaxis- heparin drip    All the records are reviewed and case discussed with ED provider. Management plans discussed with the patient, family and they are in agreement.  CODE STATUS: Full Code  TOTAL TIME TAKING CARE OF THIS PATIENT: 50 minutes.    Gladstone Lighter M.D on 03/13/2016 at 2:34 PM  Between 7am to 6pm - Pager - [phone]  After 6pm go to [email or web address] - password [email or web address] [facility]  Office  [phone]  CC: Primary care physician; No PCP Per Patient

## 2016-03-13 NOTE — Discharge Instructions (Signed)

## 2016-03-13 NOTE — Consult Note (Signed)
Cardiology Consult    Patient ID: ANIYJAH LAURANCE MRN: [id number], DOB/AGE: [birth year]   Admit date: 03/13/2016 Date of Consult: 03/13/2016  Primary Physician: No PCP Per Patient Primary Cardiologist: New - M. Fletcher Anon, MD  Requesting Provider: Claria Dice  Patient Profile    61 y/o ? without prior cardiac hx who presented to the ED 2/16 with a 3 day hx of midscapular pain, a 10 hr h/o sscp, and and elevated troponin.  Past Medical History   Past Medical History:  Diagnosis Date  . Anxiety   . B12 deficiency   . Depression   . Hashimoto's thyroiditis   . Hypothyroidism   . Near syncope    a. 08/2009 Holter - PAC's, PVC's.  . OSA (obstructive sleep apnea)    a. uses CPAP.  Marland Kitchen Osteopenia   . Overactive bladder   . Pernicious anemia   . 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 Daily Acyclovir  . Retinitis pigmentosa   . Shingles   . Vitamin D deficiency   . Vulvitis     Past Surgical History:  Procedure Laterality Date  . BREAST CYST ASPIRATION Right 2014  . CATARACT EXTRACTION    . CESAREAN SECTION    . COLONOSCOPY     a. 10/2007 - Internal hemorrohoids noted, otw nl.  . TONSILLECTOMY    . TUBAL LIGATION    . UPPER GI ENDOSCOPY     a. 12/2011 Benign polyps.     Allergies  Allergies  Allergen Reactions  . Nitrofurantoin Other (See Comments)  . Nitrofurantoin Macrocrystal Other (See Comments)    History of Present Illness    61 ? with a h/o hypothyroidism, B12 deficiency/pernicious anemia, OSA, and retinitis pigmentosa.  She has no prior cardiac hx.  She was in her usoh until Tuesday 2/13, when she awoke with lower neck and mid-scapular back pain.  She thought that perhaps she had slept funny.  Her neck pain was worse with rotation of her neck and eventually resolved but she continued to have mid-scapular back and bilat trapezius/shoulder pain.  She had her husband rub her shoulders w/o much effect.  Back pain persisted throughout the day  Tuesday, Wednesday, and Thursday.  It was not worse with any particular activity and there were no associated symptoms.  On the night of 2/15, she was quite restless in bed and could never get comfortable.  By about 2 am this morning, she began to experience severe sscp/pressure.  This persisted all night and she could not sleep.  She went to work but both back and chest pain persisted so she called her husband, who picked her up and brought her to the ED.  Here, she was given 2 baby asa with improvement in chest pain.  Back pain persists.  CXR wnl.  Trop found to be elevated @ 0.09.  ECG w/ anterior TWI, though this is old.  She currently c/o 5/10 chest pain.  Home Medications      Prior to Admission medications   Medication Sig Start Date End Date Taking? Authorizing Provider  acyclovir (ZOVIRAX) 200 MG capsule Take 200 mg by mouth daily.  09/21/14  Yes Historical Provider, MD  ALPRAZolam Duanne Moron) 0.25 MG tablet Take 0.25 mg by mouth daily.  08/30/14  Yes Historical Provider, MD  betamethasone valerate (VALISONE) 0.1 % cream Apply 0.1 application topically daily. 07/05/14  Yes Historical Provider, MD  cyanocobalamin (,VITAMIN B-12,) 1000 MCG/ML injection Inject 1,000 mcg into the  muscle every 30 (thirty) days. 03/21/15  Yes Historical Provider, MD  Estriol Micronized POWD Place 30 g vaginally 2 (two) times a week.  12/06/14  Yes Historical Provider, MD  FLUoxetine (PROZAC) 40 MG capsule Take 40 mg by mouth daily.  10/24/14  Yes Historical Provider, MD  thyroid (ARMOUR THYROID) 30 MG tablet Take by mouth.   Yes Historical Provider, MD  Vitamin D, Ergocalciferol, (DRISDOL) 50000 units CAPS capsule Take 50,000 Units by mouth once a week. 02/22/15  Yes Historical Provider, MD  fexofenadine-pseudoephedrine (ALLEGRA-D 24) 180-240 MG 24 hr tablet Take 1 tablet by mouth daily. Patient not taking: Reported on 03/13/2016 11/19/15   Versie Starks, PA-C  fluticasone [facility]) 50 MCG/ACT nasal spray Place 1 spray into  both nostrils 2 (two) times daily. 05/27/15 06/26/15  Olen Cordial, NP  predniSONE (DELTASONE) 10 MG tablet Take 3 tablets (30 mg total) by mouth daily with breakfast. Patient not taking: Reported on 03/13/2016 11/19/15   Versie Starks, PA-C  sodium chloride (OCEAN) 0.65 % SOLN nasal spray Place 1 spray into both nostrils as needed for congestion. Patient not taking: Reported on 03/13/2016 05/27/15   Olen Cordial, NP     Family History    Family History  Problem Relation Age of Onset  . Breast cancer Maternal Aunt     great  . Diabetes Mother   . Osteoarthritis Mother   . Lung cancer Father   . Hypertension Father     Social History    Social History   Social History  . Marital status: Married    Spouse name: N/A  . Number of children: N/A  . Years of education: N/A   Occupational History  . Not on file.   Social History Main Topics  . Smoking status: Former Smoker    Packs/day: 1.00    Years: 15.00  . Smokeless tobacco: Never Used     Comment: quit at age 13.  Marland Kitchen Alcohol use No  . Drug use: No  . Sexual activity: Not on file   Other Topics Concern  . Not on file   Social History Narrative   Lives in [place] w/ husband.  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  Does not routinely exercise.     Review of Systems    General:  No chills, fever, night sweats or weight changes.  Cardiovascular:  +++ chest pain, no dyspnea on exertion, edema, orthopnea, palpitations, paroxysmal nocturnal dyspnea. Dermatological: No rash, lesions/masses Respiratory: No cough, dyspnea Urologic: No hematuria, dysuria Abdominal:   No nausea, vomiting, diarrhea, bright red blood per rectum, melena, or hematemesis Neurologic:  No visual changes, wkns, changes in mental status. All other systems reviewed and are otherwise negative except as noted above.  Physical Exam    Blood pressure 117/62, pulse 76, temperature 97.7 F (36.5 C), temperature source  Oral, resp. rate 15, height 5\' 4"  (1.626 m), weight 153 lb (69.4 kg), SpO2 100 %.  General: Pleasant, NAD Psych: Normal affect. Neuro: Alert and oriented X 3. Moves all extremities spontaneously. HEENT: Normal  Neck: Supple without bruits or JVD. Lungs:  Resp regular and unlabored, CTA. Heart: RRR no s3, s4, or murmurs. Abdomen: Soft, non-tender, non-distended, BS + x 4.  Extremities: No clubbing, cyanosis or edema. DP/PT/Radials 2+ and equal bilaterally.  Labs     Recent Labs  03/13/16 1157  TROPONINI 0.09*   Lab Results  Component Value Date   WBC 9.8 03/13/2016  HGB 13.5 03/13/2016   HCT 39.4 03/13/2016   MCV 88.0 03/13/2016   PLT 235 03/13/2016    Recent Labs Lab 03/13/16 1157  NA 142  K 3.9  CL 109  CO2 25  BUN 14  CREATININE 0.68  CALCIUM 9.1  GLUCOSE 100*    Radiology Studies    Dg Chest 2 View  Result Date: 03/13/2016 CLINICAL DATA:  Chest pain EXAM: CHEST  2 VIEW COMPARISON:  01/20/2012 FINDINGS: Biapical scarring. Lungs otherwise clear. Heart is normal size. No effusions or acute bony abnormality. IMPRESSION: Biapical scarring.  No active disease. Electronically Signed   By: Rolm Baptise M.D.   On: 03/13/2016 12:41    ECG & Cardiac Imaging    RSR, 67, ant TWI - seen on prior ecg's.  Assessment & Plan    1.  NSTEMI:  Pt presented to the ED this am with a 3 day h/o mid-scapular back pain w/o associated Ss but then developed severe sscp overnight.  This persisted this morning and she had her husband drive her to [facility].  Here, ECG w/ anterior TWI, which are old, however troponin is mildly elevated @ 0.09.  She cont to c/o 5/10 chest pain.  She is hemodynamically stable.  CXR nl w/o evidence for widened mediastinum.  Heparin ordered.  Will plan on cath this afternoon.  The patient understands that risks include but are not limited to stroke (1 in 1000), death (1 in 32), kidney failure [usually temporary] (1 in 500), bleeding (1 in 200), allergic reaction  [possibly serious] (1 in 200), and agrees to proceed.  Cont asa, heparin, add  blocker and statin.  2.  Elevated BP:  BP up in ED.  No prior h/o HTN.  Add  blocker in setting of #1.  Follow.  3.  HL:  Labs from 12/02/2015: TC 227, TG 89, HDL 91.9, LDL 119.  Add statin in setting of presumed NSTEMI.  4.  Hypothyroidism:  TSH nl 12/02/2015 (1.440). Cont armour thyroid.  Signed, Murray Hodgkins, NP 03/13/2016, 3:04 PM

## 2016-03-13 NOTE — Progress Notes (Signed)
ANTICOAGULATION CONSULT NOTE - Initial Consult  Pharmacy Consult for Heparin Drip Indication: chest pain/ACS  Allergies  Allergen Reactions  . Nitrofurantoin Other (See Comments)  . Nitrofurantoin Macrocrystal Other (See Comments)    Patient Measurements: Height: 5\' 4"  (162.6 cm) Weight: 153 lb (69.4 kg) IBW/kg (Calculated) : 54.7 Vital Signs: Temp: 97.7 F (36.5 C) (02/16 1151) Temp Source: Oral (02/16 1151) BP: 146/85 (02/16 1151) Pulse Rate: 66 (02/16 1151)  Labs:  Recent Labs  03/13/16 1157  HGB 13.5  HCT 39.4  PLT 235  CREATININE 0.68  TROPONINI 0.09*    Estimated Creatinine Clearance: 71.5 mL/min (by C-G formula based on SCr of 0.68 mg/dL).   Medical History: Past Medical History:  Diagnosis Date  . Thyroid disease    Assessment: 61 yo female with chest pain x 3 days extending to shoulder blades and radiating down both arms. Pharmacy consulted for heparin dosing and monitoring.   Goal of Therapy:  Heparin level 0.3-0.7 units/ml Monitor platelets by anticoagulation protocol: Yes   Plan:  Baseline labs ordered Give 4000 units bolus x 1 Start heparin infusion at 850 units/hr Check anti-Xa level in 6 hours and daily while on heparin Continue to monitor H&H and platelets  Pernell Dupre, PharmD, BCPS Clinical Pharmacist 03/13/2016 1:29 PM

## 2016-03-13 NOTE — ED Triage Notes (Signed)
Patient presents to ED via POV with c/o CP that began on Tuesday and has progressively gotten worse. Patient states the pain is in between her shoulder blades and radiating down both arms. Patient describes the pain has a ache. A&O x4.

## 2016-03-13 NOTE — ED Provider Notes (Signed)
[facility] Emergency Department Provider Note  ____________________________________________   First MD Initiated Contact with Patient 03/13/16 1257     (approximate)  I have reviewed the triage vital signs and the nursing notes.   HISTORY  Chief Complaint Chest Pain    HPI Brenda Oneill is a 61 y.o. female who comes to the emergency department with 24 hours of intermittent left-sided chest pressure-like pain radiating to the left shoulder. It all began as left neck and left back pain which she felt was musculoskeletal yesterday but progressed to chest pain today. No shortness of breath. Pain is not exertional. She has never had a heart attack or stroke before. He has no abdominal pain nausea or vomiting. She has a past medical history of anxiety and hypothyroidism. Nothing in particular makes her pain better or worse.   Past Medical History:  Diagnosis Date  . Thyroid disease     Patient Active Problem List   Diagnosis Date Noted  . HLD (hyperlipidemia) 05/27/2015  . Adult hypothyroidism 05/27/2015  . Osteopenia 05/27/2015  . Retinitis pigmentosa 05/27/2015  . Herpes zona 05/27/2015  . Apnea, sleep 05/27/2015  . Multinodular goiter 12/04/2013  . Addison anemia 04/24/2013  . Avitaminosis D 04/24/2013    Past Surgical History:  Procedure Laterality Date  . BREAST CYST ASPIRATION Right 2014  . CATARACT EXTRACTION    . CESAREAN SECTION      Prior to Admission medications   Medication Sig Start Date End Date Taking? Authorizing Provider  acyclovir (ZOVIRAX) 200 MG capsule Take by mouth. 09/21/14   Historical Provider, MD  ALPRAZolam Duanne Moron) 0.25 MG tablet Take by mouth. 08/30/14   Historical Provider, MD  amoxicillin (AMOXIL) 875 MG tablet Take 1 tablet (875 mg total) by mouth 2 (two) times daily. 11/19/15   Versie Starks, PA-C  betamethasone valerate (VALISONE) 0.1 % cream Apply 0.1 application topically daily. 07/05/14   Historical Provider,  MD  cyanocobalamin (,VITAMIN B-12,) 1000 MCG/ML injection Inject 1,000 mcg into the muscle every 30 (thirty) days. 03/21/15   Historical Provider, MD  Estriol Micronized POWD Place 30 g vaginally once a week. 12/06/14   Historical Provider, MD  fexofenadine-pseudoephedrine (ALLEGRA-D 24) 180-240 MG 24 hr tablet Take 1 tablet by mouth daily. 11/19/15   Versie Starks, PA-C  FLUoxetine (PROZAC) 20 MG capsule Take by mouth. 10/24/14   Historical Provider, MD  fluticasone (FLONASE) 50 MCG/ACT nasal spray Place 1 spray into both nostrils 2 (two) times daily. 05/27/15 06/26/15  Olen Cordial, NP  predniSONE (DELTASONE) 10 MG tablet Take 3 tablets (30 mg total) by mouth daily with breakfast. 11/19/15   Versie Starks, PA-C  sodium chloride (OCEAN) 0.65 % SOLN nasal spray Place 1 spray into both nostrils as needed for congestion. 05/27/15   Olen Cordial, NP  thyroid (ARMOUR THYROID) 30 MG tablet Take by mouth.    Historical Provider, MD  Vitamin D, Ergocalciferol, (DRISDOL) 50000 units CAPS capsule Take 50,000 Units by mouth once a week. 02/22/15   Historical Provider, MD    Allergies Nitrofurantoin and Nitrofurantoin macrocrystal  Family History  Problem Relation Age of Onset  . Breast cancer Maternal Aunt     great    Social History Social History  Substance Use Topics  . Smoking status: Never Smoker  . Smokeless tobacco: Never Used  . Alcohol use No    Review of Systems Constitutional: No fever/chills Eyes: No visual changes. ENT: No sore throat. Cardiovascular: Denies  chest pain. Respiratory: Reports shortness of breath. Gastrointestinal: No abdominal pain.  No nausea, no vomiting.  No diarrhea.  No constipation. Genitourinary: Negative for dysuria. Musculoskeletal: Reports left back and left neck pain Skin: Negative for rash. Neurological: Negative for headaches, focal weakness or numbness.  10-point ROS otherwise  negative.  ____________________________________________   PHYSICAL EXAM:  VITAL SIGNS: ED Triage Vitals  Enc Vitals Group     BP 03/13/16 1151 (!) 146/85     Pulse Rate 03/13/16 1151 66     Resp 03/13/16 1151 18     Temp 03/13/16 1151 97.7 F (36.5 C)     Temp Source 03/13/16 1151 Oral     SpO2 03/13/16 1151 98 %     Weight 03/13/16 1153 153 lb (69.4 kg)     Height 03/13/16 1153 5\' 4"  (1.626 m)     Head Circumference --      Peak Flow --      Pain Score 03/13/16 1153 10     Pain Loc --      Pain Edu? --      Excl. in [place]? --     Constitutional: Alert and oriented. Well appearing and in no acute distress. Eyes: Conjunctivae are normal. PERRL. EOMI. Head: Atraumatic. Nose: No congestion/rhinnorhea. Mouth/Throat: Mucous membranes are moist.  Oropharynx non-erythematous. Neck: No stridor.   Cardiovascular: Normal rate, regular rhythm. Grossly normal heart sounds.  Good peripheral circulation. Respiratory: Normal respiratory effort.  No retractions. Lungs CTAB. Gastrointestinal: Soft and nontender. No distention. No abdominal bruits. No CVA tenderness. Musculoskeletal: No lower extremity tenderness nor edema.  No joint effusions. Neurologic:  Normal speech and language. No gross focal neurologic deficits are appreciated. No gait instability. Skin:  Skin is warm, dry and intact. No rash noted. Psychiatric: Mood and affect are normal. Speech and behavior are normal.  ____________________________________________   LABS (all labs ordered are listed, but only abnormal results are displayed)  Labs Reviewed  BASIC METABOLIC PANEL - Abnormal; Notable for the following:       Result Value   Glucose, Bld 100 (*)    All other components within normal limits  TROPONIN I - Abnormal; Notable for the following:    Troponin I 0.09 (*)    All other components within normal limits  CBC   ____________________________________________  EKG  ED ECG REPORT I, Darel Hong, the  attending physician, personally viewed and interpreted this ECG.  Date: 03/13/2016 Rate: 67 Rhythm: normal sinus rhythm QRS Axis: normal Intervals: normal ST/T Wave abnormalities: T-wave inversion in V2 V3 and V4 Conduction Disturbances: none Narrative Interpretation: Consistent with possible myocardial ischemia  ____________________________________________  RADIOLOGY  Unremarkable ____________________________________________   PROCEDURES  Procedure(s) performed: no  Procedures  Critical Care performed: no  ____________________________________________   INITIAL IMPRESSION / ASSESSMENT AND PLAN / ED COURSE  Pertinent labs & imaging results that were available during my care of the patient were reviewed by me and considered in my medical decision making (see chart for details).     The patient has intermittent pressure-like left chest pain radiating to her left arm along with an elevated troponin and EKG changes which are concerning for ischemia. Repeat EKG is unchanged from first. This likely represents a non-STEMI. Current on call cardiologist is Dr. Fletcher Anon who I have paged.  ----------------------------------------- 1:20 PM on 03/13/2016 -----------------------------------------  Dr. Fletcher Anon recommends unfractionated heparin drip and I will admit her to the hospital. I discussed the case with the hospitalist who is graciously agreed  to admit the patient to his service. ____________________________________________   FINAL CLINICAL IMPRESSION(S) / ED DIAGNOSES  Final diagnoses:  None      NEW MEDICATIONS STARTED DURING THIS VISIT:  New Prescriptions   No medications on file     Note:  This document was prepared using Dragon voice recognition software and may include unintentional dictation errors.     Darel Hong, MD 03/13/16 [phone]

## 2016-03-13 NOTE — Progress Notes (Signed)
The Nurse in [place] [facility] paged the [facility] and asked him to come and provide emotional and spiritual support for the Pt's family members who were in the [facility] Family waiting Rm. CH met with Pt's husband and stay with him and then later the Pt's sister arrived. [place] provided emotional support, prayer and presence for the family. When Pt's husband calmed down, CH promised the family that another [facility] would be available to provided support for the family and then left.   03/13/16 1700  Clinical Encounter Type  Visited With Patient;Patient and family together  Visit Type Initial;Spiritual support;Code;Other (Comment)  Referral From Nurse  Consult/Referral To Chaplain  Spiritual Encounters  Spiritual Needs Prayer;Emotional;Other (Comment)

## 2016-03-14 ENCOUNTER — Inpatient Hospital Stay (HOSPITAL_COMMUNITY)
Admit: 2016-03-14 | Discharge: 2016-03-14 | Disposition: A | Discharge status: Home / Self Care | Payer: Managed Care, Other (non HMO) | Attending: Internal Medicine | Admitting: Internal Medicine

## 2016-03-14 ENCOUNTER — Inpatient Hospital Stay: Payer: Managed Care, Other (non HMO)

## 2016-03-14 DIAGNOSIS — J189 Pneumonia, unspecified organism: Secondary | ICD-10-CM

## 2016-03-14 DIAGNOSIS — R030 Elevated blood-pressure reading, without diagnosis of hypertension: Secondary | ICD-10-CM

## 2016-03-14 DIAGNOSIS — D72829 Elevated white blood cell count, unspecified: Secondary | ICD-10-CM

## 2016-03-14 DIAGNOSIS — R110 Nausea: Secondary | ICD-10-CM

## 2016-03-14 DIAGNOSIS — R778 Other specified abnormalities of plasma proteins: Secondary | ICD-10-CM

## 2016-03-14 DIAGNOSIS — R739 Hyperglycemia, unspecified: Secondary | ICD-10-CM

## 2016-03-14 DIAGNOSIS — R079 Chest pain, unspecified: Secondary | ICD-10-CM

## 2016-03-14 DIAGNOSIS — R7989 Other specified abnormal findings of blood chemistry: Secondary | ICD-10-CM

## 2016-03-14 LAB — LIPID PANEL
CHOL/HDL RATIO: 2.3 ratio
CHOLESTEROL: 165 mg/dL (ref 0–200)
HDL: 73 mg/dL (ref >40)
LDL Cholesterol: 83 mg/dL (ref 0–99)
Triglycerides: 47 mg/dL (ref <150)
VLDL: 9 mg/dL (ref 0–40)

## 2016-03-14 LAB — TROPONIN I
TROPONIN I: 0.14 ng/mL — AB (ref <0.03)
TROPONIN I: 0.14 ng/mL — AB (ref <0.03)

## 2016-03-14 LAB — CBC
HCT: 36.1 % (ref 35.0–47.0)
Hemoglobin: 12.3 g/dL (ref 12.0–16.0)
MCH: 29.7 pg (ref 26.0–34.0)
MCHC: 34 g/dL (ref 32.0–36.0)
MCV: 87.3 fL (ref 80.0–100.0)
PLATELETS: 213 10*3/uL (ref 150–440)
RBC: 4.14 MIL/uL (ref 3.80–5.20)
RDW: 13.2 % (ref 11.5–14.5)
WBC: 11.7 10*3/uL — ABNORMAL HIGH (ref 3.6–11.0)

## 2016-03-14 LAB — BASIC METABOLIC PANEL
Anion gap: 6 (ref 5–15)
BUN: 11 mg/dL (ref 6–20)
CALCIUM: 8.7 mg/dL — AB (ref 8.9–10.3)
CO2: 23 mmol/L (ref 22–32)
CREATININE: 0.71 mg/dL (ref 0.44–1.00)
Chloride: 109 mmol/L (ref 101–111)
GFR calc non Af Amer: >60 mL/min (ref >60)
Glucose, Bld: 109 mg/dL — ABNORMAL HIGH (ref 65–99)
Potassium: 3.7 mmol/L (ref 3.5–5.1)
Sodium: 138 mmol/L (ref 135–145)

## 2016-03-14 LAB — HEMOGLOBIN A1C
HEMOGLOBIN A1C: 5 % (ref 4.8–5.6)
Mean Plasma Glucose: 97 mg/dL

## 2016-03-14 LAB — HIV ANTIBODY (ROUTINE TESTING W REFLEX): HIV Screen 4th Generation wRfx: NONREACTIVE

## 2016-03-14 LAB — ECHOCARDIOGRAM COMPLETE
HEIGHTINCHES: 63 in
Weight: 2480 oz

## 2016-03-14 MED ORDER — AZITHROMYCIN 250 MG PO TABS: 250.0000 mg | ORAL_TABLET | Freq: Every day | ORAL | Status: DC

## 2016-03-14 MED ORDER — AZITHROMYCIN 250 MG PO TABS: 250.0000 mg | ORAL_TABLET | Freq: Every day | ORAL | 0 refills | Status: DC

## 2016-03-14 MED ORDER — IOPAMIDOL (ISOVUE-370) INJECTION 76%
75.0000 mL | Freq: Once | INTRAVENOUS | Status: AC | PRN
  Administered 2016-03-14: 75 mL via INTRAVENOUS

## 2016-03-14 MED ORDER — ONDANSETRON HCL 4 MG PO TABS: 4.0000 mg | ORAL_TABLET | Freq: Four times a day (QID) | ORAL | 0 refills | Status: DC | PRN

## 2016-03-14 MED ORDER — METOPROLOL TARTRATE 25 MG PO TABS: 25.0000 mg | ORAL_TABLET | Freq: Two times a day (BID) | ORAL | 5 refills | Status: DC

## 2016-03-14 MED ORDER — AZITHROMYCIN 500 MG PO TABS
500.0000 mg | ORAL_TABLET | Freq: Every day | ORAL | Status: AC
  Administered 2016-03-14: 500 mg via ORAL
  Filled 2016-03-14: qty 1

## 2016-03-14 NOTE — Discharge Summary (Signed)
[facility] NAME: Brenda Oneill    MR#:  [id number]  DATE OF BIRTH:  11-Jan-1956  DATE OF ADMISSION:  03/13/2016 ADMITTING PHYSICIAN: Gladstone Lighter, MD  DATE OF DISCHARGE: No discharge date for patient encounter.  PRIMARY CARE PHYSICIAN: No PCP Per Patient     ADMISSION DIAGNOSIS:  NSTEMI (non-ST elevated myocardial infarction) ([place]) [I21.4]  DISCHARGE DIAGNOSIS:  Principal Problem:   Chest pain Active Problems:   Nausea without vomiting   Pneumonitis   Leukocytosis   Elevated troponin   hyperglycemia   Hyperglycemia   SECONDARY DIAGNOSIS:   Past Medical History:  Diagnosis Date  . Anxiety   . B12 deficiency   . Depression   . Hashimoto's thyroiditis   . Hypothyroidism   . Near syncope    a. 08/2009 Holter - PAC's, PVC's.  . OSA (obstructive sleep apnea)    a. uses CPAP.  Marland Kitchen Osteopenia   . Overactive bladder   . Pernicious anemia   . 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 Daily Acyclovir  . Retinitis pigmentosa   . Shingles   . Vitamin D deficiency   . Vulvitis     .pro HOSPITAL COURSE:   The patient is a 61-year-old Caucasian female with past medical history significant for history of anxiety, B12 deficiency, depression, Hashimoto's thyroiditis, hypothyroidism, obstructive sleep apnea, for which she uses CPAP, who presents to the hospital with complaints of neck pain, pain between the shoulder blades, down to her arms, which started about 3 days ago. She also admitted of nausea, occasional palpitations, intermittent sweats. In emergency room, she was noted to have T elevations in anterior leads, as well as her first appointment was noted to be elevated. She was admitted to the hospital for further evaluation and treatment. She was initiated on metoprolol, aspirin, Lipitor, nitroglycerin, Lovenox, her cardiac enzymes were followed, revealing troponin elevation to 0.14. Patient  underwent cardiac catheterization, showed normal coronary arteries, visually normal ejection fraction. Cardiology did not feel that patient's pain was cardiac, recommended CT angiogram, which was remarkable for no PE, but groundglass opacity in posterior segment of right upper lobe and a suspected localized area of interstitial pneumonia. Patient was initiated on Zithromax and discharged home in good condition. She was advised to follow-up with her primary care physician and have a CT of chest repeated in about 6 weeks after discharge. Patient was ambulated and since she felt good and she is being discharged home. Discussion by problem: #1. Chest pain of unclear etiology at this time, unlikely cardiac, per cardiologist, status post cardiac catheterization with normal coronary arteries and normal ejection fraction on the16th of February 2018, CT angiogram also showed no pulmonary embolism, but possible pneumonitis. Patient's chest pain has resolved read. Patient is recommended to continue metoprolol per cardiologist, due to concerns of coronary spasm #2. Pneumonitis, initiate patient on Zithromax, continue antibiotics for about a week, get CT of the chest repeated in about 6 weeks, per radiologist #3. Repeated troponin, likely demand ischemia, not acute coronary syndrome, status post cardiac catheterization with normal findings, patient is to continue metoprolol due to concerns of coronary spasm, per cardiologist #4. Leukocytosis, continue antibiotic therapy, reassess CBC as outpatient  DISCHARGE CONDITIONS:   Stable  CONSULTS OBTAINED:  Treatment Team:  Wellington Hampshire, MD Will Meredith Leeds, MD  DRUG ALLERGIES:   Allergies  Allergen Reactions  . Nitrofurantoin Other (See Comments)  . Nitrofurantoin Macrocrystal Other (  See Comments)    DISCHARGE MEDICATIONS:   Current Discharge Medication List    START taking these medications   Details  azithromycin (ZITHROMAX) 250 MG tablet Take 1  tablet (250 mg total) by mouth daily. Qty: 6 each, Refills: 0    metoprolol tartrate (LOPRESSOR) 25 MG tablet Take 1 tablet (25 mg total) by mouth 2 (two) times daily. Qty: 60 tablet, Refills: 5    ondansetron (ZOFRAN) 4 MG tablet Take 1 tablet (4 mg total) by mouth every 6 (six) hours as needed for nausea. Qty: 20 tablet, Refills: 0      CONTINUE these medications which have NOT CHANGED   Details  acyclovir (ZOVIRAX) 200 MG capsule Take 200 mg by mouth daily.     ALPRAZolam (XANAX) 0.25 MG tablet Take 0.25 mg by mouth daily.     betamethasone valerate (VALISONE) 0.1 % cream Apply 0.1 application topically daily.    cyanocobalamin (,VITAMIN B-12,) 1000 MCG/ML injection Inject 1,000 mcg into the muscle every 30 (thirty) days.    Estriol Micronized POWD Place 30 g vaginally 2 (two) times a week.     FLUoxetine (PROZAC) 40 MG capsule Take 40 mg by mouth daily.     thyroid (ARMOUR THYROID) 30 MG tablet Take by mouth.    Vitamin D, Ergocalciferol, (DRISDOL) 50000 units CAPS capsule Take 50,000 Units by mouth once a week.    fexofenadine-pseudoephedrine (ALLEGRA-D 24) 180-240 MG 24 hr tablet Take 1 tablet by mouth daily. Qty: 30 tablet, Refills: 12    sodium chloride (OCEAN) 0.65 % SOLN nasal spray Place 1 spray into both nostrils as needed for congestion. Refills: 0      STOP taking these medications     fluticasone (FLONASE) 50 MCG/ACT nasal spray      predniSONE (DELTASONE) 10 MG tablet          DISCHARGE INSTRUCTIONS:    The patient is to follow-up with primary care physician in about 1 week after discharge  If you experience worsening of your admission symptoms, develop shortness of breath, life threatening emergency, suicidal or homicidal thoughts you must seek medical attention immediately by calling 911 or calling your MD immediately  if symptoms less severe.  You Must read complete instructions/literature along with all the possible adverse reactions/side  effects for all the Medicines you take and that have been prescribed to you. Take any new Medicines after you have completely understood and accept all the possible adverse reactions/side effects.   Please note  You were cared for by a hospitalist during your hospital stay. If you have any questions about your discharge medications or the care you received while you were in the hospital after you are discharged, you can call the unit and asked to speak with the hospitalist on call if the hospitalist that took care of you is not available. Once you are discharged, your primary care physician will handle any further medical issues. Please note that NO REFILLS for any discharge medications will be authorized once you are discharged, as it is imperative that you return to your primary care physician (or establish a relationship with a primary care physician if you do not have one) for your aftercare needs so that they can reassess your need for medications and monitor your lab values.    Today   CHIEF COMPLAINT:   Chief Complaint  Patient presents with  . Chest Pain    HISTORY OF PRESENT ILLNESS:  Brenda Oneill  is a 61 y.o. female  with a known history of anxiety, B12 deficiency, depression, Hashimoto's thyroiditis, hypothyroidism, obstructive sleep apnea, for which she uses CPAP, who presents to the hospital with complaints of neck pain, pain between the shoulder blades, down to her arms, which started about 3 days ago. She also admitted of nausea, occasional palpitations, intermittent sweats. In emergency room, she was noted to have T elevations in anterior leads, as well as her first appointment was noted to be elevated. She was admitted to the hospital for further evaluation and treatment. She was initiated on metoprolol, aspirin, Lipitor, nitroglycerin, Lovenox, her cardiac enzymes were followed, revealing troponin elevation to 0.14. Patient underwent cardiac catheterization, showed normal  coronary arteries, visually normal ejection fraction. Cardiology did not feel that patient's pain was cardiac, recommended CT angiogram, which was remarkable for no PE, but groundglass opacity in posterior segment of right upper lobe and a suspected localized area of interstitial pneumonia. Patient was initiated on Zithromax and discharged home in good condition. She was advised to follow-up with her primary care physician and have a CT of chest repeated in about 6 weeks after discharge. Patient was ambulated and since she felt good and she is being discharged home. Discussion by problem: #1. Chest pain of unclear etiology at this time, unlikely cardiac, per cardiologist, status post cardiac catheterization with normal coronary arteries and normal ejection fraction on the16th of February 2018, CT angiogram also showed no pulmonary embolism, but possible pneumonitis. Patient's chest pain has resolved read. Patient is recommended to continue metoprolol per cardiologist, due to concerns of coronary spasm #2. Pneumonitis, initiate patient on Zithromax, continue antibiotics for about a week, get CT of the chest repeated in about 6 weeks, per radiologist #3. Repeated troponin, likely demand ischemia, not acute coronary syndrome, status post cardiac catheterization with normal findings, patient is to continue metoprolol due to concerns of coronary spasm, per cardiologist #4. Leukocytosis, continue antibiotic therapy, reassess CBC as outpatient   VITAL SIGNS:  Blood pressure (!) 105/53, pulse 78, temperature 98 F (36.7 C), temperature source Oral, resp. rate 16, height 5\' 3"  (1.6 m), weight 70.3 kg (155 lb), SpO2 95 %.  I/O:   Intake/Output Summary (Last 24 hours) at 03/14/16 1344 Last data filed at 03/14/16 0458  Gross per 24 hour  Intake                0 ml  Output                0 ml  Net                0 ml    PHYSICAL EXAMINATION:  GENERAL:  61 y.o.-year-old patient lying in the bed with no  acute distress.  EYES: Pupils equal, round, reactive to light and accommodation. No scleral icterus. Extraocular muscles intact.  HEENT: Head atraumatic, normocephalic. Oropharynx and nasopharynx clear.  NECK:  Supple, no jugular venous distention. No thyroid enlargement, no tenderness.  LUNGS: Normal breath sounds bilaterally, no wheezing, rales,rhonchi or crepitation. No use of accessory muscles of respiration.  CARDIOVASCULAR: S1, S2 normal. No murmurs, rubs, or gallops.  ABDOMEN: Soft, non-tender, non-distended. Bowel sounds present. No organomegaly or mass.  EXTREMITIES: No pedal edema, cyanosis, or clubbing.  NEUROLOGIC: Cranial nerves II through XII are intact. Muscle strength 5/5 in all extremities. Sensation intact. Gait not checked.  PSYCHIATRIC: The patient is alert and oriented x 3.  SKIN: No obvious rash, lesion, or ulcer.   DATA REVIEW:   CBC  Recent Labs  Lab 03/14/16 0339  WBC 11.7*  HGB 12.3  HCT 36.1  PLT 213    Chemistries   Recent Labs Lab 03/14/16 0339  NA 138  K 3.7  CL 109  CO2 23  GLUCOSE 109*  BUN 11  CREATININE 0.71  CALCIUM 8.7*    Cardiac Enzymes  Recent Labs Lab 03/14/16 0339  TROPONINI 0.14*    Microbiology Results  Results for orders placed or performed in visit on 03/20/13  Stool culture     Status: None   Collection Time: 03/21/13  9:10 AM  Result Value Ref Range Status   Micro Text Report   Final       COMMENT                   NO SALMONELLA OR SHIGELLA ISOLATED   COMMENT                   NO PATHOGENIC E.COLI DETECTED   COMMENT                   NO CAMPYLOBACTER ANTIGEN DETECTED   ANTIBIOTIC                                                        RADIOLOGY:  Dg Chest 2 View  Result Date: 03/13/2016 CLINICAL DATA:  Chest pain EXAM: CHEST  2 VIEW COMPARISON:  01/20/2012 FINDINGS: Biapical scarring. Lungs otherwise clear. Heart is normal size. No effusions or acute bony abnormality. IMPRESSION: Biapical scarring.  No  active disease. Electronically Signed   By: Rolm Baptise M.D.   On: 03/13/2016 12:41   Ct Angio Chest Pe W Or Wo Contrast  Result Date: 03/14/2016 CLINICAL DATA:  Chest pain for 3 days. EXAM: CT ANGIOGRAPHY CHEST WITH CONTRAST TECHNIQUE: Multidetector CT imaging of the chest was performed using the standard protocol during bolus administration of intravenous contrast. Multiplanar CT image reconstructions and MIPs were obtained to evaluate the vascular anatomy. CONTRAST:  75 mL Isovue 370 nonionic COMPARISON:  Chest radiograph March 13, 2016 FINDINGS: Cardiovascular: There is no demonstrable pulmonary embolus. There is no thoracic aortic aneurysm or dissection. Visualized great vessels appear normal. There is no appreciable pericardial thickening. Mediastinum/Nodes: There is an 8 mm nodule arising from the upper pole of the right lobe of the thyroid. Thyroid otherwise appears unremarkable. There is no demonstrable thoracic adenopathy. Lungs/Pleura: There is bibasilar atelectatic type change. There is ground-glass type opacity in the medial aspect of the posterior segment of the right upper lobe measuring 2.9 x 2.0 cm. No appreciable pleural effusion or pleural thickening. Upper Abdomen: Visualized upper abdominal structures appear normal. Musculoskeletal: No blastic or lytic bone lesions evident. Review of the MIP images confirms the above findings. IMPRESSION: No demonstrable pulmonary embolus. Bibasilar atelectasis. Ground-glass type opacity medially located in the posterior segment of the right upper lobe, best appreciated on axial slice 30 series 6. Suspect localized area of interstitial pneumonia. Atypical neoplasm could present in this manner. Would advise in this regard follow-up noncontrast enhanced CT in for 6 weeks to assess clearing. If this area persists, further assessment at that time for potential neoplasm would need to be considered. No evident adenopathy. Subcentimeter nodular opacity arising  the superior thyroid. Per consensus guidelines, this nodular lesion does not require additional surveillance. Electronically  Signed   By: Lowella Grip III M.D.   On: 03/14/2016 09:45    EKG:   Orders placed or performed during the hospital encounter of 03/13/16  . EKG 12-Lead  . EKG 12-Lead      Management plans discussed with the patient, family and they are in agreement.  CODE STATUS:     Code Status Orders        Start     Ordered   03/13/16 1532  Full code  Continuous     03/13/16 1531    Code Status History    Date Active Date Inactive Code Status Order ID Comments User Context   This patient has a current code status but no historical code status.      TOTAL TIME TAKING CARE OF THIS PATIENT: 40 minutes.  Discussed with Dr. Curt Bears , cardiologist  Nneoma Harral M.D on 03/14/2016 at 1:44 PM  Between 7am to 6pm - Pager - [phone]  After 6pm go to [email or web address] - password [email or web address]  [facility]  Office  [phone]  CC: Primary care physician; No PCP Per Patient

## 2016-03-14 NOTE — Progress Notes (Signed)
Pt admitted from cath lab. VSS, no complaints of pain, SR on telemetry. Right radial dressing is clean, dry and intact. Right arm elevated on pillow.

## 2016-03-14 NOTE — Progress Notes (Signed)
Progress Note  Patient Name: Brenda Oneill Date of Encounter: 03/14/2016  Primary Cardiologist: Audelia Acton (new)  Subjective   A cardiac catheterization yesterday that showed normal coronary arteries, with a visually normal ejection fraction. TTE pending today. She said that she feels well without any chest pain. She did have an episode of pain between her shoulder blades yesterday that resolved on its own. Her pain was previously treated with Vicodin the emergency room that improved the discomfort.   Inpatient Medications    Scheduled Meds: . acyclovir  200 mg Oral Daily  . ALPRAZolam  0.25 mg Oral Daily  . aspirin EC  325 mg Oral Daily  . atorvastatin  40 mg Oral q1800  . FLUoxetine  40 mg Oral Daily  . metoprolol tartrate  25 mg Oral BID  . sodium chloride flush  3 mL Intravenous Q12H  . sodium chloride flush  3 mL Intravenous Q12H  . thyroid  30 mg Oral QAC breakfast  . [START ON 03/15/2016] Vitamin D (Ergocalciferol)  50,000 Units Oral Weekly   Continuous Infusions: . sodium chloride 75 mL/hr at 03/14/16 0818   PRN Meds: sodium chloride, acetaminophen **OR** acetaminophen, HYDROcodone-acetaminophen, ondansetron **OR** ondansetron (ZOFRAN) IV, sodium chloride flush   Vital Signs    Vitals:   03/13/16 1800 03/13/16 1818 03/14/16 0458 03/14/16 0804  BP: 112/63 (!) 108/58 (!) 103/55 109/60  Pulse: 72 73 84 79  Resp: 18  18   Temp:  98.4 F (36.9 C) 99.5 F (37.5 C) 98.2 F (36.8 C)  TempSrc:  Oral Oral Oral  SpO2: 95% 96% 96%   Weight:  155 lb (70.3 kg)    Height:  5\' 3"  (1.6 m)      Intake/Output Summary (Last 24 hours) at 03/14/16 1047 Last data filed at 03/14/16 0458  Gross per 24 hour  Intake                0 ml  Output                0 ml  Net                0 ml   Filed Weights   03/13/16 1153 03/13/16 1532 03/13/16 1818  Weight: 153 lb (69.4 kg) 153 lb (69.4 kg) 155 lb (70.3 kg)    Telemetry    Sinus rhythm - Personally Reviewed  ECG      Sinus rhythm with old anterior T-wave inversions - Personally Reviewed  Physical Exam   GEN: No acute distress.   Neck: No JVD Cardiac: RRR, no murmurs, rubs, or gallops.  Respiratory: Clear to auscultation bilaterally. GI: Soft, nontender, non-distended  MS: No edema; No deformity. Neuro:  Nonfocal  Psych: Normal affect   Labs    Chemistry Recent Labs Lab 03/13/16 1157 03/14/16 0339  NA 142 138  K 3.9 3.7  CL 109 109  CO2 25 23  GLUCOSE 100* 109*  BUN 14 11  CREATININE 0.68 0.71  CALCIUM 9.1 8.7*  GFRNONAA >60 >60  GFRAA >60 >60  ANIONGAP 8 6     Hematology Recent Labs Lab 03/13/16 1157 03/14/16 0339  WBC 9.8 11.7*  RBC 4.48 4.14  HGB 13.5 12.3  HCT 39.4 36.1  MCV 88.0 87.3  MCH 30.1 29.7  MCHC 34.2 34.0  RDW 13.3 13.2  PLT 235 213    Cardiac Enzymes Recent Labs Lab 03/13/16 1157 03/13/16 1814 03/14/16 0019 03/14/16 0339  TROPONINI 0.09* 0.14* 0.14* 0.14*  No results for input(s): TROPIPOC in the last 168 hours.   BNPNo results for input(s): BNP, PROBNP in the last 168 hours.   DDimer No results for input(s): DDIMER in the last 168 hours.   Radiology    Dg Chest 2 View  Result Date: 03/13/2016 CLINICAL DATA:  Chest pain EXAM: CHEST  2 VIEW COMPARISON:  01/20/2012 FINDINGS: Biapical scarring. Lungs otherwise clear. Heart is normal size. No effusions or acute bony abnormality. IMPRESSION: Biapical scarring.  No active disease. Electronically Signed   By: Rolm Baptise M.D.   On: 03/13/2016 12:41   Ct Angio Chest Pe W Or Wo Contrast  Result Date: 03/14/2016 CLINICAL DATA:  Chest pain for 3 days. EXAM: CT ANGIOGRAPHY CHEST WITH CONTRAST TECHNIQUE: Multidetector CT imaging of the chest was performed using the standard protocol during bolus administration of intravenous contrast. Multiplanar CT image reconstructions and MIPs were obtained to evaluate the vascular anatomy. CONTRAST:  75 mL Isovue 370 nonionic COMPARISON:  Chest radiograph March 13, 2016 FINDINGS: Cardiovascular: There is no demonstrable pulmonary embolus. There is no thoracic aortic aneurysm or dissection. Visualized great vessels appear normal. There is no appreciable pericardial thickening. Mediastinum/Nodes: There is an 8 mm nodule arising from the upper pole of the right lobe of the thyroid. Thyroid otherwise appears unremarkable. There is no demonstrable thoracic adenopathy. Lungs/Pleura: There is bibasilar atelectatic type change. There is ground-glass type opacity in the medial aspect of the posterior segment of the right upper lobe measuring 2.9 x 2.0 cm. No appreciable pleural effusion or pleural thickening. Upper Abdomen: Visualized upper abdominal structures appear normal. Musculoskeletal: No blastic or lytic bone lesions evident. Review of the MIP images confirms the above findings. IMPRESSION: No demonstrable pulmonary embolus. Bibasilar atelectasis. Ground-glass type opacity medially located in the posterior segment of the right upper lobe, best appreciated on axial slice 30 series 6. Suspect localized area of interstitial pneumonia. Atypical neoplasm could present in this manner. Would advise in this regard follow-up noncontrast enhanced CT in for 6 weeks to assess clearing. If this area persists, further assessment at that time for potential neoplasm would need to be considered. No evident adenopathy. Subcentimeter nodular opacity arising the superior thyroid. Per consensus guidelines, this nodular lesion does not require additional surveillance. Electronically Signed   By: Lowella Grip III M.D.   On: 03/14/2016 09:45    Cardiac Studies    The left ventricular systolic function is normal.  LV end diastolic pressure is mildly elevated.  The left ventricular ejection fraction is 55-65% by visual estimate.   1. Normal coronary arteries. 2. Normal LV systolic function and mildly elevated left ventricular end-diastolic pressure.  Recommendations: No culprit  is identified for unstable angina. I recommend CTA of the chest to evaluate for possible PE. Sarinah Doetsch hydrate with IV fluids overnight.  Patient Profile     61 y.o. female h/o hypothyroidism, B12 deficiency/pernicious anemia, OSA, and retinitis pigmentosa Resented to the hospital with chest pain found to have a troponin 0.09.  Assessment & Plan    1.  NSTEMI:  Cardiac cath performed that showed no obvious signs of coronary artery disease. She did have a troponin that peaked at 0.14. She was started on a beta blocker. Would continue beta blocker. If transthoracic echo is normal, would be able to be discharged.  2.  Elevated BP:   blood pressure much improved today. Continue beta blocker.  3.  HL:   no obvious signs of coronary artery disease. Low CV risk.  Can stop statin.  4.  Hypothyroidism:  TSH nl 12/02/2015 (1.440). Cont armour thyroid.  Signed, Delanna Blacketer Meredith Leeds, MD  03/14/2016, 10:47 AM

## 2016-03-16 ENCOUNTER — Encounter: Payer: Self-pay | Admitting: Cardiovascular Disease

## 2016-03-17 ENCOUNTER — Encounter: Payer: Self-pay | Admitting: Respiratory Therapy

## 2016-03-18 ENCOUNTER — Ambulatory Visit (INDEPENDENT_AMBULATORY_CARE_PROVIDER_SITE_OTHER): Payer: Managed Care, Other (non HMO) | Admitting: Cardiology

## 2016-03-18 ENCOUNTER — Encounter: Payer: Self-pay | Admitting: Cardiology

## 2016-03-18 VITALS — BP 110/80 | HR 61 | Ht 63.5 in | Wt 158.2 lb

## 2016-03-18 DIAGNOSIS — I248 Other forms of acute ischemic heart disease: Secondary | ICD-10-CM | POA: Diagnosis not present

## 2016-03-18 DIAGNOSIS — R748 Abnormal levels of other serum enzymes: Secondary | ICD-10-CM | POA: Diagnosis not present

## 2016-03-18 DIAGNOSIS — R778 Other specified abnormalities of plasma proteins: Secondary | ICD-10-CM

## 2016-03-18 DIAGNOSIS — R7989 Other specified abnormal findings of blood chemistry: Principal | ICD-10-CM

## 2016-03-18 NOTE — Patient Instructions (Signed)
Medication Instructions:  No changes  Labwork: None today  Testing/Procedures: None today  Follow-Up: Your physician wants you to follow-up in: 9 months. You will receive a reminder letter in the mail two months in advance. If you don't receive a letter, please call our office to schedule the follow-up appointment.  It was a pleasure seeing you today here in the office. Please do not hesitate to give [place] a call back if you have any further questions. [place], BSN

## 2016-03-18 NOTE — Progress Notes (Signed)
Cardiology Office Note   Date:  03/18/2016   ID:  Brenda Oneill, DOB 07-17-1955, MRN [id number]  Referring Doctor:  [name], MD   Cardiologist:   Wende Bushy, MD   Reason for consultation:  Chief Complaint  Patient presents with  . other    NSTEMI AT [facility] 03/15/2016. elevated troponin, abnormal EKG, per Dr. Ramonita Lab. Pt states she is doing well. Reviewed meds with pt verbally.      History of Present Illness: Brenda Oneill is a 61 y.o. female who presents for Follow-up after admission for chest pain, positive troponin  Records were reviewed. She presented with chest pain, abnormal EKG with T-wave inversions, minimally elevated troponin level. This was on the weekend. She was labeled as ACS and was sent for heart cath. Heart cath revealed normal coronary arteries. Concern was need to rule out PE. CT chest was done, no PE, evidence of pneumonia. She was discharged on beta blocker for concern of vasospasm.  Since her last visit, she has had no recurrence of chest pain. No shortness of breath. No issues with bleeding.  No PND, orthopnea, edema. No palpitations. No syncope.   ROS:  Please see the history of present illness. Aside from mentioned under HPI, all other systems are reviewed and negative.     Past Medical History:  Diagnosis Date  . Anxiety   . B12 deficiency   . Depression   . Hashimoto's thyroiditis   . Hypothyroidism   . Near syncope    a. 08/2009 Holter - PAC's, PVC's.  . OSA (obstructive sleep apnea)    a. uses CPAP.  Marland Kitchen Osteopenia   . Overactive bladder   . Pernicious anemia   . 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 Daily Acyclovir  . Retinitis pigmentosa   . Shingles   . Vitamin D deficiency   . Vulvitis     Past Surgical History:  Procedure Laterality Date  . BREAST CYST ASPIRATION Right 2014  . CATARACT EXTRACTION    . CESAREAN SECTION    . COLONOSCOPY     a. 10/2007 - Internal hemorrohoids noted,  otw nl.  Marland Kitchen LEFT HEART CATH AND CORONARY ANGIOGRAPHY N/A 03/13/2016   Procedure: Left Heart Cath and Coronary Angiography;  Surgeon: Wellington Hampshire, MD;  Location: [facility];  Service: Cardiovascular;  Laterality: N/A;  . TONSILLECTOMY    . TUBAL LIGATION    . UPPER GI ENDOSCOPY     a. 12/2011 Benign polyps.     reports that she has quit smoking. She has a 15.00 pack-year smoking history. She has never used smokeless tobacco. She reports that she does not drink alcohol or use drugs.   family history includes Breast cancer in her maternal aunt; Diabetes in her mother; Hypertension in her father; Lung cancer in her father; Osteoarthritis in her mother.   Outpatient Medications Prior to Visit  Medication Sig Dispense Refill  . acyclovir (ZOVIRAX) 200 MG capsule Take 200 mg by mouth daily.     Marland Kitchen ALPRAZolam (XANAX) 0.25 MG tablet Take 0.25 mg by mouth daily.     Marland Kitchen azithromycin (ZITHROMAX) 250 MG tablet Take 1 tablet (250 mg total) by mouth daily. 6 each 0  . betamethasone valerate (VALISONE) 0.1 % cream Apply 0.1 application topically daily.    . cyanocobalamin (,VITAMIN B-12,) 1000 MCG/ML injection Inject 1,000 mcg into the muscle every 30 (thirty) days.    . Estriol  Micronized POWD Place 30 g vaginally 2 (two) times a week.     . fexofenadine-pseudoephedrine (ALLEGRA-D 24) 180-240 MG 24 hr tablet Take 1 tablet by mouth daily. 30 tablet 12  . FLUoxetine (PROZAC) 40 MG capsule Take 40 mg by mouth daily.     . metoprolol tartrate (LOPRESSOR) 25 MG tablet Take 1 tablet (25 mg total) by mouth 2 (two) times daily. 60 tablet 5  . ondansetron (ZOFRAN) 4 MG tablet Take 1 tablet (4 mg total) by mouth every 6 (six) hours as needed for nausea. 20 tablet 0  . sodium chloride (OCEAN) 0.65 % SOLN nasal spray Place 1 spray into both nostrils as needed for congestion.  0  . thyroid (ARMOUR THYROID) 30 MG tablet Take by mouth.    . Vitamin D, Ergocalciferol, (DRISDOL) 50000 units CAPS capsule Take  50,000 Units by mouth once a week.     No facility-administered medications prior to visit.      Allergies: Nitrofurantoin and Nitrofurantoin macrocrystal    PHYSICAL EXAM: VS:  BP 110/80 (BP Location: Left Arm, Patient Position: Sitting, Cuff Size: Normal)   Pulse 61   Ht 5' 3.5" (1.613 m)   Wt 158 lb 4 oz (71.8 kg)   BMI 27.59 kg/m  , Body mass index is 27.59 kg/m. Wt Readings from Last 3 Encounters:  03/18/16 158 lb 4 oz (71.8 kg)  03/13/16 155 lb (70.3 kg)    GENERAL:  well developed, well nourished, not in acute distress HEENT: normocephalic, pink conjunctivae, anicteric sclerae, no xanthelasma, normal dentition, oropharynx clear NECK:  no neck vein engorgement, JVP normal, no hepatojugular reflux, carotid upstroke brisk and symmetric, no bruit, no thyromegaly, no lymphadenopathy LUNGS:  good respiratory effort, clear to auscultation bilaterally CV:  PMI not displaced, no thrills, no lifts, S1 and S2 within normal limits, no palpable S3 or S4, no murmurs, no rubs, no gallops ABD:  Soft, nontender, nondistended, normoactive bowel sounds, no abdominal aortic bruit, no hepatomegaly, no splenomegaly MS: nontender back, no kyphosis, no scoliosis, no joint deformities EXT:  2+ DP/PT pulses, no edema, no varicosities, no cyanosis, no clubbing SKIN: warm, nondiaphoretic, normal turgor, no ulcers. Mild bruising over the right radial artery, no bruit 2+ radial pulses bilaterally. NEUROPSYCH: alert, oriented to person, place, and time, sensory/motor grossly intact, normal mood, appropriate affect  Recent Labs: 03/14/2016: BUN 11; Creatinine, Ser 0.71; Hemoglobin 12.3; Platelets 213; Potassium 3.7; Sodium 138   Lipid Panel    Component Value Date/Time   CHOL 165 03/14/2016 0019   TRIG 47 03/14/2016 0019   HDL 73 03/14/2016 0019   CHOLHDL 2.3 03/14/2016 0019   VLDL 9 03/14/2016 0019   LDLCALC 83 03/14/2016 0019     Other studies Reviewed:  EKG:  The ekg from 03/18/2016 was  personally reviewed by me and it revealed sinus rhythm, 61 BPM.  Additional studies/ records that were reviewed personally reviewed by me today include:  Echo 03/14/2016: Left ventricle: The cavity size was normal. Systolic function was   normal. The estimated ejection fraction was in the range of 55%   to 60%. Wall motion was normal; there were no regional wall   motion abnormalities. Left ventricular diastolic function   parameters were normal. - Mitral valve: There was mild regurgitation. - Atrial septum: No defect or patent foramen ovale was identified  Heart cath 03/13/2016:  The left ventricular systolic function is normal.  LV end diastolic pressure is mildly elevated.  The left ventricular ejection fraction is  55-65% by visual estimate.   1. Normal coronary arteries. 2. Normal LV systolic function and mildly elevated left ventricular end-diastolic pressure.  ASSESSMENT AND PLAN: Elevated troponin level Normal coronary arteries Possibly demand ischemia from pneumonia versus coronary vasospasm Continue medical therapy with aspirin 81 mg by mouth daily. Continue beta blocker for now if tolerated. May consider decreasing dose or weaning her off of beta blocker if significant hypotension, systolic less than 90 and with symptoms, or relatively low blood pressure with significant symptoms of lightheadedness, dizziness, presyncope. So far, she does not have any of those symptoms. We'll continue her on the low dose beta blocker for now. Continue risk factor modification. Heart healthy diet and increased physical activity.   Current medicines are reviewed at length with the patient today.  The patient does not have concerns regarding medicines.  Labs/ tests ordered today include:  Orders Placed This Encounter  Procedures  . EKG 12-Lead    I had a lengthy and detailed discussion with the patient regarding diagnoses, prognosis, diagnostic options, treatment options , and side  effects of medications.   I counseled the patient on importance of lifestyle modification including heart healthy diet, regular physical activity .   I spent at least 40 minutes with the patient today and more than 50% of the time was spent counseling the patient and coordinating care.    Disposition:   FU with Cardiology in 9 months   Signed, Wende Bushy, MD  03/18/2016 2:40 PM    [place]  This note was generated in part with voice recognition software and I apologize for any typographical errors that were not detected and corrected.

## 2016-04-14 ENCOUNTER — Other Ambulatory Visit: Payer: Self-pay | Admitting: Internal Medicine

## 2016-04-14 DIAGNOSIS — J189 Pneumonia, unspecified organism: Secondary | ICD-10-CM

## 2016-04-17 ENCOUNTER — Ambulatory Visit: Payer: Managed Care, Other (non HMO)

## 2016-04-21 ENCOUNTER — Ambulatory Visit: Payer: Managed Care, Other (non HMO)

## 2016-04-23 ENCOUNTER — Ambulatory Visit
Admission: RE | Admit: 2016-04-23 | Discharge: 2016-04-23 | Disposition: A | Discharge status: Home / Self Care | Payer: Managed Care, Other (non HMO) | Source: Ambulatory Visit | Attending: Internal Medicine | Admitting: Internal Medicine

## 2016-04-23 DIAGNOSIS — I700 Atherosclerosis of aorta: Secondary | ICD-10-CM | POA: Diagnosis not present

## 2016-04-23 DIAGNOSIS — J189 Pneumonia, unspecified organism: Secondary | ICD-10-CM | POA: Diagnosis present

## 2016-12-15 ENCOUNTER — Ambulatory Visit (INDEPENDENT_AMBULATORY_CARE_PROVIDER_SITE_OTHER): Payer: Managed Care, Other (non HMO) | Admitting: Cardiovascular Disease

## 2016-12-15 ENCOUNTER — Encounter: Payer: Self-pay | Admitting: Cardiovascular Disease

## 2016-12-15 VITALS — BP 100/64 | HR 63 | Ht 63.5 in | Wt 152.8 lb

## 2016-12-15 DIAGNOSIS — R0789 Other chest pain: Secondary | ICD-10-CM

## 2016-12-15 NOTE — Progress Notes (Signed)
Cardiology Office Note   Date:  12/15/2016   ID:  Brenda Oneill, DOB 27-Mar-1955, MRN [id number]  PCP:  [name], MD  Cardiologist:   Kathlyn Sacramento, MD   Chief Complaint  Patient presents with  . other    9 month follow up. Patient denies chet pain and SOB. Meds reviewed verbally with patient.       History of Present Illness: Brenda Oneill is a 61 y.o. female who presents for for a follow-up visit.  She was hospitalized in February with chest pain associated with EKG changes and mildly elevated troponin.  She underwent cardiac catheterization which showed normal coronary arteries and ejection fraction.  Echocardiogram was unremarkable.  CTA showed no evidence of pulmonary embolism.  She was subsequently treated for pneumonia.  She has been doing well since then.  Metoprolol was discontinued due to low blood pressure. No recent chest pain, shortness of breath or palpitations.  She retired and she has been less stressed.   Past Medical History:  Diagnosis Date  . Anxiety   . B12 deficiency   . Depression   . Hashimoto's thyroiditis   . Hypothyroidism   . Near syncope    a. 08/2009 Holter - PAC's, PVC's.  . OSA (obstructive sleep apnea)    a. uses CPAP.  Marland Kitchen Osteopenia   . Overactive bladder   . Pernicious anemia   . 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 Daily Acyclovir  . Retinitis pigmentosa   . Shingles   . Vitamin D deficiency   . Vulvitis     Past Surgical History:  Procedure Laterality Date  . BREAST CYST ASPIRATION Right 2014  . CATARACT EXTRACTION    . CESAREAN SECTION    . COLONOSCOPY     a. 10/2007 - Internal hemorrohoids noted, otw nl.  Marland Kitchen LEFT HEART CATH AND CORONARY ANGIOGRAPHY N/A 03/13/2016   Procedure: Left Heart Cath and Coronary Angiography;  Surgeon: Wellington Hampshire, MD;  Location: [facility];  Service: Cardiovascular;  Laterality: N/A;  . TONSILLECTOMY    . TUBAL LIGATION    . UPPER GI ENDOSCOPY      a. 12/2011 Benign polyps.     Current Outpatient Medications  Medication Sig Dispense Refill  . acyclovir (ZOVIRAX) 200 MG capsule Take 200 mg by mouth daily.     Marland Kitchen ALPRAZolam (XANAX) 0.25 MG tablet Take 0.25 mg by mouth daily.     Marland Kitchen aspirin 81 MG chewable tablet Chew 81 mg by mouth daily.    Marland Kitchen azithromycin (ZITHROMAX) 250 MG tablet Take 1 tablet (250 mg total) by mouth daily. 6 each 0  . betamethasone valerate (VALISONE) 0.1 % cream Apply 0.1 application topically daily.    . cyanocobalamin (,VITAMIN B-12,) 1000 MCG/ML injection Inject 1,000 mcg into the muscle every 30 (thirty) days.    . Estriol Micronized POWD Place 30 g vaginally 2 (two) times a week.     . fexofenadine-pseudoephedrine (ALLEGRA-D 24) 180-240 MG 24 hr tablet Take 1 tablet by mouth daily. 30 tablet 12  . FLUoxetine (PROZAC) 40 MG capsule Take 40 mg by mouth daily.     . metoprolol tartrate (LOPRESSOR) 25 MG tablet Take 1 tablet (25 mg total) by mouth 2 (two) times daily. 60 tablet 5  . ondansetron (ZOFRAN) 4 MG tablet Take 1 tablet (4 mg total) by mouth every 6 (six) hours as needed for nausea. 20 tablet 0  . sodium  chloride (OCEAN) 0.65 % SOLN nasal spray Place 1 spray into both nostrils as needed for congestion.  0  . thyroid (ARMOUR THYROID) 30 MG tablet Take by mouth.    . Vitamin D, Ergocalciferol, (DRISDOL) 50000 units CAPS capsule Take 50,000 Units by mouth once a week.     No current facility-administered medications for this visit.     Allergies:   Nitrofurantoin and Nitrofurantoin macrocrystal    Social History:  The patient  reports that she has quit smoking. She has a 15.00 pack-year smoking history. she has never used smokeless tobacco. She reports that she does not drink alcohol or use drugs.   Family History:  The patient's family history includes Breast cancer in her maternal aunt; Diabetes in her mother; Hypertension in her father; Lung cancer in her father; Osteoarthritis in her mother.     ROS:  Please see the history of present illness.   Otherwise, review of systems are positive for none.   All other systems are reviewed and negative.    PHYSICAL EXAM: VS:  BP 100/64 (BP Location: Left Arm, Patient Position: Sitting, Cuff Size: Normal)   Pulse 63   Ht 5' 3.5" (1.613 m)   Wt 152 lb 12 oz (69.3 kg)   BMI 26.63 kg/m  , BMI Body mass index is 26.63 kg/m. GEN: Well nourished, well developed, in no acute distress  HEENT: normal  Neck: no JVD, carotid bruits, or masses Cardiac: RRR; no murmurs, rubs, or gallops,no edema  Respiratory:  clear to auscultation bilaterally, normal work of breathing GI: soft, nontender, nondistended, + BS MS: no deformity or atrophy  Skin: warm and dry, no rash Neuro:  Strength and sensation are intact Psych: euthymic mood, full affect   EKG:  EKG is ordered today. The ekg ordered today demonstrates normal sinus rhythm with low voltage and nonspecific T wave changes   Recent Labs: 03/14/2016: BUN 11; Creatinine, Ser 0.71; Hemoglobin 12.3; Platelets 213; Potassium 3.7; Sodium 138    Lipid Panel    Component Value Date/Time   CHOL 165 03/14/2016 0019   TRIG 47 03/14/2016 0019   HDL 73 03/14/2016 0019   CHOLHDL 2.3 03/14/2016 0019   VLDL 9 03/14/2016 0019   LDLCALC 83 03/14/2016 0019      Wt Readings from Last 3 Encounters:  12/15/16 152 lb 12 oz (69.3 kg)  03/18/16 158 lb 4 oz (71.8 kg)  03/13/16 155 lb (70.3 kg)       PAD Screen 03/18/2016  Previous PAD dx? No  Previous surgical procedure? No  Pain with walking? No  Feet/toe relief with dangling? No  Painful, non-healing ulcers? No  Extremities discolored? No      ASSESSMENT AND PLAN:  1.  Previous chest pain with mildly elevated troponin.  Cardiac catheterization in February showed normal coronary arteries.  She had no recurrent symptoms.  She can follow-up with me as needed.   Disposition:   FU with me as needed  Signed,  Kathlyn Sacramento, MD  12/15/2016  4:25 PM    [facility]

## 2016-12-15 NOTE — Patient Instructions (Signed)
Medication Instructions: No changes.   Labwork: None.   Procedures/Testing: None.   Follow-Up: As needed with Dr. Fletcher Anon.   Any Additional Special Instructions Will Be Listed Below (If Applicable).     If you need a refill on your cardiac medications before your next appointment, please call your pharmacy.

## 2016-12-22 ENCOUNTER — Other Ambulatory Visit: Payer: Self-pay | Admitting: Obstetrics and Gynecology

## 2017-01-12 ENCOUNTER — Other Ambulatory Visit: Payer: Self-pay | Admitting: Obstetrics and Gynecology

## 2017-01-12 DIAGNOSIS — Z1231 Encounter for screening mammogram for malignant neoplasm of breast: Secondary | ICD-10-CM

## 2017-02-03 ENCOUNTER — Ambulatory Visit: Payer: Managed Care, Other (non HMO)

## 2017-02-11 ENCOUNTER — Ambulatory Visit
Admission: RE | Admit: 2017-02-11 | Discharge: 2017-02-11 | Disposition: A | Discharge status: Home / Self Care | Payer: Managed Care, Other (non HMO) | Source: Ambulatory Visit | Attending: Obstetrics and Gynecology | Admitting: Obstetrics and Gynecology

## 2017-02-11 DIAGNOSIS — Z1231 Encounter for screening mammogram for malignant neoplasm of breast: Secondary | ICD-10-CM | POA: Insufficient documentation

## 2017-05-11 ENCOUNTER — Ambulatory Visit: Payer: Self-pay

## 2017-05-11 ENCOUNTER — Ambulatory Visit: Payer: Self-pay | Admitting: Family Medicine

## 2017-05-11 ENCOUNTER — Encounter: Payer: Self-pay | Admitting: Family Medicine

## 2017-05-11 VITALS — BP 114/57 | HR 79 | Temp 98.0°F | Resp 16

## 2017-05-11 DIAGNOSIS — J029 Acute pharyngitis, unspecified: Secondary | ICD-10-CM

## 2017-05-11 DIAGNOSIS — J309 Allergic rhinitis, unspecified: Secondary | ICD-10-CM

## 2017-05-11 LAB — POCT RAPID STREP A (OFFICE): Rapid Strep A Screen: NEGATIVE

## 2017-05-11 MED ORDER — FLUTICASONE PROPIONATE 50 MCG/ACT NA SUSP: NASAL | 0 refills | Status: AC

## 2017-05-11 NOTE — Progress Notes (Signed)
Subjective: Sore throat     Brenda Oneill is a 62 y.o. female who presents for evaluation of sore throat, nasal congestion, bilateral ear pain, chills, and mild nonproductive cough for 2-3 days.  Patient reports her predominant symptom is sore throat.  Patient reports her only medical history is hypothyroidism and allergic rhinitis.  Treatment attempted at home: Allegra-D and Tylenol. Denies rash, nausea, vomiting, diarrhea, SOB, wheezing, chest or back pain, difficulty swallowing, confusion, AMS, dental pain, facial pressure, headache, body aches, fatigue, fever, sneezing, ocular pruritis/discharge, severe symptoms, or initial improvement and then worsening of symptoms. History of smoking, asthma, COPD: Negative History of recurrent sinus and/or lung infections: Negative.  Review of Systems Pertinent items noted in HPI and remainder of comprehensive ROS otherwise negative.     Objective:   Physical Exam General: Awake, alert, and oriented. No acute distress. Well developed, hydrated and nourished. Appears stated age. Nontoxic appearance.  HEENT:  PND noted.  Mild erythema to posterior oropharynx.  No edema or exudates of pharynx or tonsils. No erythema or bulging of TM.  Mild erythema/edema to nasal mucosa. Sinuses nontender. Supple neck without adenopathy. Cardiac: Heart rate and rhythm are normal. No murmurs, gallops, or rubs are auscultated. S1 and S2 are heard and are of normal intensity.  Respiratory: No signs of respiratory distress. Lungs clear. No tachypnea. Able to speak in full sentences without dyspnea. Nonlabored respirations.  Skin: Skin is warm, dry and intact. Appropriate color for ethnicity. No cyanosis noted.   Diagnostic Results: Rapid strep test negative.  Assessment:    viral pharyngitis   Plan:    Discussed the importance of avoiding unnecessary antibiotic therapy. Suggested symptomatic OTC remedies. Nasal saline spray for congestion.   Flonase for allergic  rhinitis symptoms and discussed how to use this. Follow-up with primary care provider. Discussed red flag symptoms and circumstances with which to seek medical care.   New Prescriptions   FLUTICASONE (FLONASE) 50 MCG/ACT NASAL SPRAY    1-2 sprays in each nostril daily

## 2017-11-09 ENCOUNTER — Encounter: Payer: Self-pay | Admitting: *Deleted

## 2017-11-10 ENCOUNTER — Encounter
Admission: RE | Disposition: A | Discharge status: Home / Self Care | Payer: Self-pay | Source: Ambulatory Visit | Attending: Unknown Physician Specialty

## 2017-11-10 ENCOUNTER — Ambulatory Visit: Payer: Managed Care, Other (non HMO) | Admitting: Certified Registered Nurse Anesthetist

## 2017-11-10 ENCOUNTER — Ambulatory Visit
Admission: RE | Admit: 2017-11-10 | Discharge: 2017-11-10 | Disposition: A | Discharge status: Home / Self Care | Payer: Managed Care, Other (non HMO) | Source: Ambulatory Visit | Attending: Unknown Physician Specialty | Admitting: Unknown Physician Specialty

## 2017-11-10 ENCOUNTER — Other Ambulatory Visit: Payer: Self-pay

## 2017-11-10 DIAGNOSIS — D123 Benign neoplasm of transverse colon: Secondary | ICD-10-CM | POA: Insufficient documentation

## 2017-11-10 DIAGNOSIS — E559 Vitamin D deficiency, unspecified: Secondary | ICD-10-CM | POA: Diagnosis not present

## 2017-11-10 DIAGNOSIS — Z1211 Encounter for screening for malignant neoplasm of colon: Secondary | ICD-10-CM | POA: Diagnosis present

## 2017-11-10 DIAGNOSIS — E063 Autoimmune thyroiditis: Secondary | ICD-10-CM | POA: Diagnosis not present

## 2017-11-10 DIAGNOSIS — G4733 Obstructive sleep apnea (adult) (pediatric): Secondary | ICD-10-CM | POA: Diagnosis not present

## 2017-11-10 DIAGNOSIS — Z79899 Other long term (current) drug therapy: Secondary | ICD-10-CM | POA: Insufficient documentation

## 2017-11-10 DIAGNOSIS — Z7982 Long term (current) use of aspirin: Secondary | ICD-10-CM | POA: Insufficient documentation

## 2017-11-10 DIAGNOSIS — F329 Major depressive disorder, single episode, unspecified: Secondary | ICD-10-CM | POA: Insufficient documentation

## 2017-11-10 DIAGNOSIS — D122 Benign neoplasm of ascending colon: Secondary | ICD-10-CM | POA: Insufficient documentation

## 2017-11-10 DIAGNOSIS — K640 First degree hemorrhoids: Secondary | ICD-10-CM | POA: Insufficient documentation

## 2017-11-10 DIAGNOSIS — Z7951 Long term (current) use of inhaled steroids: Secondary | ICD-10-CM | POA: Diagnosis not present

## 2017-11-10 DIAGNOSIS — D510 Vitamin B12 deficiency anemia due to intrinsic factor deficiency: Secondary | ICD-10-CM | POA: Diagnosis not present

## 2017-11-10 DIAGNOSIS — F419 Anxiety disorder, unspecified: Secondary | ICD-10-CM | POA: Insufficient documentation

## 2017-11-10 DIAGNOSIS — Z79890 Hormone replacement therapy (postmenopausal): Secondary | ICD-10-CM | POA: Insufficient documentation

## 2017-11-10 DIAGNOSIS — Z87891 Personal history of nicotine dependence: Secondary | ICD-10-CM | POA: Diagnosis not present

## 2017-11-10 DIAGNOSIS — B001 Herpesviral vesicular dermatitis: Secondary | ICD-10-CM | POA: Diagnosis not present

## 2017-11-10 DIAGNOSIS — K621 Rectal polyp: Secondary | ICD-10-CM | POA: Insufficient documentation

## 2017-11-10 DIAGNOSIS — Z8371 Family history of colonic polyps: Secondary | ICD-10-CM | POA: Insufficient documentation

## 2017-11-10 HISTORY — PX: COLONOSCOPY WITH PROPOFOL: SHX5780

## 2017-11-10 HISTORY — DX: Atherosclerosis of aorta: I70.0

## 2017-11-10 HISTORY — DX: Adverse effect of unspecified anesthetic, initial encounter: T41.45XA

## 2017-11-10 HISTORY — DX: Other complications of anesthesia, initial encounter: T88.59XA

## 2017-11-10 HISTORY — DX: Other specified disorders of bone density and structure, unspecified site: M85.80

## 2017-11-10 HISTORY — DX: Asymptomatic menopausal state: Z78.0

## 2017-11-10 HISTORY — DX: Hyperlipidemia, unspecified: E78.5

## 2017-11-10 HISTORY — DX: Age-related osteoporosis without current pathological fracture: M81.0

## 2017-11-10 SURGERY — COLONOSCOPY WITH PROPOFOL
Anesthesia: General

## 2017-11-10 MED ORDER — SODIUM CHLORIDE 0.9 % IV SOLN
INTRAVENOUS | Status: DC
  Administered 2017-11-10: 07:00:00 via INTRAVENOUS

## 2017-11-10 MED ORDER — LIDOCAINE HCL (CARDIAC) PF 100 MG/5ML IV SOSY
PREFILLED_SYRINGE | INTRAVENOUS | Status: DC | PRN
  Administered 2017-11-10: 100 mg via INTRAVENOUS

## 2017-11-10 MED ORDER — MIDAZOLAM HCL 5 MG/5ML IJ SOLN
INTRAMUSCULAR | Status: AC
  Filled 2017-11-10: qty 5

## 2017-11-10 MED ORDER — SODIUM CHLORIDE 0.9 % IV SOLN: INTRAVENOUS | Status: DC

## 2017-11-10 MED ORDER — LIDOCAINE HCL (PF) 1 % IJ SOLN
INTRAMUSCULAR | Status: AC
  Filled 2017-11-10: qty 2

## 2017-11-10 MED ORDER — PROPOFOL 500 MG/50ML IV EMUL
INTRAVENOUS | Status: DC | PRN
  Administered 2017-11-10: 85 ug/kg/min via INTRAVENOUS

## 2017-11-10 MED ORDER — PROPOFOL 500 MG/50ML IV EMUL
INTRAVENOUS | Status: AC
  Filled 2017-11-10: qty 50

## 2017-11-10 MED ORDER — FENTANYL CITRATE (PF) 100 MCG/2ML IJ SOLN
INTRAMUSCULAR | Status: DC | PRN
  Administered 2017-11-10: 50 ug via INTRAVENOUS

## 2017-11-10 MED ORDER — MIDAZOLAM HCL 5 MG/5ML IJ SOLN
INTRAMUSCULAR | Status: DC | PRN
  Administered 2017-11-10: 1 mg via INTRAVENOUS

## 2017-11-10 MED ORDER — FENTANYL CITRATE (PF) 250 MCG/5ML IJ SOLN
INTRAMUSCULAR | Status: AC
  Filled 2017-11-10: qty 5

## 2017-11-10 MED ORDER — LIDOCAINE HCL (PF) 2 % IJ SOLN
INTRAMUSCULAR | Status: AC
  Filled 2017-11-10: qty 70

## 2017-11-10 MED ORDER — EPHEDRINE SULFATE 50 MG/ML IJ SOLN
INTRAMUSCULAR | Status: DC | PRN
  Administered 2017-11-10: 5 mg via INTRAVENOUS

## 2017-11-10 MED ORDER — GLYCOPYRROLATE 0.2 MG/ML IJ SOLN
INTRAMUSCULAR | Status: AC
  Filled 2017-11-10: qty 1

## 2017-11-10 MED ORDER — PROPOFOL 10 MG/ML IV BOLUS
INTRAVENOUS | Status: DC | PRN
  Administered 2017-11-10: 30 mg via INTRAVENOUS
  Administered 2017-11-10: 70 mg via INTRAVENOUS

## 2017-11-10 NOTE — Transfer of Care (Signed)
Immediate Anesthesia Transfer of Care Note  Patient: Brenda Oneill  Procedure(s) Performed: COLONOSCOPY WITH PROPOFOL (N/A )  Patient Location: PACU  Anesthesia Type:General  Level of Consciousness: awake, alert , oriented and patient cooperative  Airway & Oxygen Therapy: Patient Spontanous Breathing and Patient connected to nasal cannula oxygen  Post-op Assessment: Report given to RN and Post -op Vital signs reviewed and stable  Post vital signs: Reviewed and stable  Last Vitals:  Vitals Value Taken Time  BP    Temp    Pulse 75 11/10/2017  8:13 AM  Resp 14 11/10/2017  8:13 AM  SpO2 99 % 11/10/2017  8:13 AM  Vitals shown include unvalidated device data.  Last Pain:  Vitals:   11/10/17 0716  TempSrc: Tympanic         Complications: No apparent anesthesia complications

## 2017-11-10 NOTE — Anesthesia Preprocedure Evaluation (Signed)
Anesthesia Evaluation  Patient identified by MRN, date of birth, ID band Patient awake    Reviewed: Allergy & Precautions, H&P , NPO status , Patient's Chart, lab work & pertinent test results, reviewed documented beta blocker date and time   History of Anesthesia Complications Negative for: history of anesthetic complications  Airway Mallampati: I  TM Distance: >3 FB Neck ROM: full    Dental  (+) Dental Advidsory Given, Missing, Partial Lower   Pulmonary neg shortness of breath, sleep apnea and Continuous Positive Airway Pressure Ventilation , neg COPD, neg recent URI, former smoker,           Cardiovascular Exercise Tolerance: Good negative cardio ROS       Neuro/Psych PSYCHIATRIC DISORDERS Anxiety Depression negative neurological ROS     GI/Hepatic negative GI ROS, Neg liver ROS,   Endo/Other  neg diabetesHypothyroidism   Renal/GU negative Renal ROS  negative genitourinary   Musculoskeletal   Abdominal   Peds  Hematology negative hematology ROS (+)   Anesthesia Other Findings Past Medical History: No date: Anxiety No date: Aortic atherosclerosis (HCC) No date: B12 deficiency No date: Complication of anesthesia     Comment:  slow to wake up with one surgery years ago No date: Depression No date: Hashimoto's thyroiditis No date: Hyperlipemia No date: Hypothyroidism No date: Near syncope     Comment:  a. 08/2009 Holter - PAC's, PVC's. No date: OSA (obstructive sleep apnea)     Comment:  a. uses CPAP. 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 Daily Acyclovir No date: Retinitis pigmentosa No date: Retinitis pigmentosa No date: Shingles No date: Vitamin D deficiency No date: Vulvitis   Reproductive/Obstetrics negative OB ROS                              Anesthesia Physical Anesthesia Plan  ASA: II  Anesthesia Plan: General   Post-op Pain Management:    Induction: Intravenous  PONV Risk Score and Plan: 3 and Propofol infusion and TIVA  Airway Management Planned: Nasal Cannula and Natural Airway  Additional Equipment:   Intra-op Plan:   Post-operative Plan:   Informed Consent: I have reviewed the patients History and Physical, chart, labs and discussed the procedure including the risks, benefits and alternatives for the proposed anesthesia with the patient or authorized representative who has indicated his/her understanding and acceptance.   Dental Advisory Given  Plan Discussed with: Anesthesiologist, CRNA and Surgeon  Anesthesia Plan Comments:         Anesthesia Quick Evaluation

## 2017-11-10 NOTE — H&P (Signed)
Primary Care Physician:  [name], MD Primary Gastroenterologist:  Dr. Vira Agar  Pre-Procedure History & Physical: HPI:  Brenda Oneill is a 62 y.o. female is here for an colonoscopy. FH colon polyps in both parents.  Previous colonoscopy 10/28/07 no polyps at that time.   Past Medical History:  Diagnosis Date  . Anxiety   . Aortic atherosclerosis ([place])   . B12 deficiency   . Complication of anesthesia    slow to wake up with one surgery years ago  . Depression   . Hashimoto's thyroiditis   . Hyperlipemia   . Hypothyroidism   . Near syncope    a. 08/2009 Holter - PAC's, PVC's.  . OSA (obstructive sleep apnea)    a. uses CPAP.  Marland Kitchen Osteopenia   . Osteopenia after menopause   . Overactive bladder   . Pernicious anemia   . 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 Daily Acyclovir  . Retinitis pigmentosa   . Retinitis pigmentosa   . Shingles   . Vitamin D deficiency   . Vulvitis     Past Surgical History:  Procedure Laterality Date  . BREAST CYST ASPIRATION Right 2014  . CATARACT EXTRACTION    . CESAREAN SECTION    . COLONOSCOPY     a. 10/2007 - Internal hemorrohoids noted, otw nl.  Marland Kitchen CYST REMOVAL NECK    . LEFT HEART CATH AND CORONARY ANGIOGRAPHY N/A 03/13/2016   Procedure: Left Heart Cath and Coronary Angiography;  Surgeon: Wellington Hampshire, MD;  Location: [facility];  Service: Cardiovascular;  Laterality: N/A;  . TONSILLECTOMY    . TUBAL LIGATION    . UPPER GI ENDOSCOPY     a. 12/2011 Benign polyps.    Prior to Admission medications   Medication Sig Start Date End Date Taking? Authorizing Provider  aspirin 81 MG chewable tablet Chew 81 mg by mouth daily.   Yes [provider]  betamethasone valerate (VALISONE) 0.1 % cream Apply topically 2 (two) times daily as needed.    Yes [provider]  FLUoxetine (PROZAC) 40 MG capsule Take 40 mg by mouth daily.  10/24/14  Yes [provider]  Multiple Vitamin  (MULTIVITAMIN) tablet Take 1 tablet by mouth daily.   Yes [provider]  thyroid (ARMOUR THYROID) 30 MG tablet Take by mouth.   Yes [provider]  vitamin A 10000 UNIT capsule Take 10,000 Units by mouth daily.   Yes [provider]  Vitamin D, Ergocalciferol, (DRISDOL) 50000 units CAPS capsule Take 50,000 Units by mouth once a week. 02/22/15  Yes [provider]  acyclovir (ZOVIRAX) 200 MG/5ML suspension Take 200 mg by mouth daily.     [provider]  ALPRAZolam Duanne Moron) 0.25 MG tablet Take 0.25 mg by mouth daily.  08/30/14   [provider]  cyanocobalamin (,VITAMIN B-12,) 1000 MCG/ML injection Inject 1,000 mcg into the muscle every 30 (thirty) days. 03/21/15   [provider]  fluticasone (FLONASE) 50 MCG/ACT nasal spray 1-2 sprays in each nostril daily Patient taking differently: as needed. 1-2 sprays in each nostril daily 05/11/17   McManama, Franchot Mimes, FNP  sodium chloride (OCEAN) 0.65 % SOLN nasal spray Place 1 spray into both nostrils as needed for congestion. 05/27/15   Betancourt, Aura Fey, NP    Allergies as of 09/06/2017 - Review Complete 05/11/2017  Allergen Reaction Noted  . Nitrofurantoin Other (See Comments) 05/27/2015  . Nitrofurantoin macrocrystal Other (  See Comments) 12/17/2014    Family History  Problem Relation Age of Onset  . Breast cancer Maternal Aunt        great  . Diabetes Mother   . Osteoarthritis Mother   . Lung cancer Father   . Hypertension Father     Social History   Socioeconomic History  . Marital status: Married    Spouse name: Not on file  . Number of children: Not on file  . Years of education: Not on file  . Highest education level: Not on file  Occupational History  . Not on file  Social Needs  . Financial resource strain: Not on file  . Food insecurity:    Worry: Not on file    Inability: Not on file  . Transportation needs:    Medical: Not on file    Non-medical: Not on  file  Tobacco Use  . Smoking status: Former Smoker    Packs/day: 1.00    Years: 15.00    Pack years: 15.00  . Smokeless tobacco: Never Used  . Tobacco comment: quit at age 72.  Substance and Sexual Activity  . Alcohol use: No    Alcohol/week: 0.0 standard drinks  . Drug use: No  . Sexual activity: Not on file  Lifestyle  . Physical activity:    Days per week: Not on file    Minutes per session: Not on file  . Stress: Not on file  Relationships  . Social connections:    Talks on phone: Not on file    Gets together: Not on file    Attends religious service: Not on file    Active member of club or organization: Not on file    Attends meetings of clubs or organizations: Not on file    Relationship status: Not on file  . Intimate partner violence:    Fear of current or ex partner: Not on file    Emotionally abused: Not on file    Physically abused: Not on file    Forced sexual activity: Not on file  Other Topics Concern  . Not on file  Social History Narrative   Lives in [place] w/ husband.  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  Does not routinely exercise.    Review of Systems: See HPI, otherwise negative ROS  Physical Exam: BP 124/64   Pulse 63   Temp 97.9 F (36.6 C) (Tympanic)   Resp 18   Ht 5\' 3"  (1.6 m)   Wt 69.4 kg   SpO2 100%   BMI 27.10 kg/m  General:   Alert,  pleasant and cooperative in NAD Head:  Normocephalic and atraumatic. Neck:  Supple; no masses or thyromegaly. Lungs:  Clear throughout to auscultation.    Heart:  Regular rate and rhythm. Abdomen:  Soft, nontender and nondistended. Normal bowel sounds, without guarding, and without rebound.   Neurologic:  Alert and  oriented x4;  grossly normal neurologically.  Impression/Plan: Brenda Oneill is here for an colonoscopy to be performed for FH colon polyps.  Risks, benefits, limitations, and alternatives regarding  colonoscopy have been reviewed with the  patient.  Questions have been answered.  All parties agreeable.   Gaylyn Cheers, MD  11/10/2017, 7:27 AM

## 2017-11-10 NOTE — Anesthesia Postprocedure Evaluation (Signed)
Anesthesia Post Note  Patient: Brenda Oneill  Procedure(s) Performed: COLONOSCOPY WITH PROPOFOL (N/A )  Patient location during evaluation: Endoscopy Anesthesia Type: General Level of consciousness: awake and alert Pain management: pain level controlled Vital Signs Assessment: post-procedure vital signs reviewed and stable Respiratory status: spontaneous breathing, nonlabored ventilation, respiratory function stable and patient connected to nasal cannula oxygen Cardiovascular status: blood pressure returned to baseline and stable Postop Assessment: no apparent nausea or vomiting Anesthetic complications: no     Last Vitals:  Vitals:   11/10/17 0830 11/10/17 0840  BP: (!) 105/59 98/62  Pulse: 65 62  Resp: (!) 7 12  Temp:    SpO2: 99% 99%    Last Pain:  Vitals:   11/10/17 0810  TempSrc: Tympanic                 Martha Clan

## 2017-11-10 NOTE — Op Note (Signed)
[facility] Gastroenterology Patient Name: Brenda Oneill Procedure Date: 11/10/2017 7:15 AM MRN: [id number] Account #: [id number] Date of Birth: 02-08-1955 Admit Type: Outpatient Age: 62 Room: [facility] ENDO ROOM 2 Gender: Female Note Status: Finalized Procedure:            Colonoscopy Indications:          Colon cancer screening in patient at increased risk:                        Family history of 1st-degree relative with colon polyps Providers:            Manya Silvas, MD Referring MD:         Ramonita Lab, MD (Referring MD) Medicines:            Propofol per Anesthesia Complications:        No immediate complications. Procedure:            Pre-Anesthesia Assessment:                       - After reviewing the risks and benefits, the patient                        was deemed in satisfactory condition to undergo the                        procedure.                       After obtaining informed consent, the colonoscope was                        passed under direct vision. Throughout the procedure,                        the patient's blood pressure, pulse, and oxygen                        saturations were monitored continuously. The                        Colonoscope was introduced through the anus and                        advanced to the the cecum, identified by appendiceal                        orifice and ileocecal valve. The colonoscopy was                        performed without difficulty. The patient tolerated the                        procedure well. The quality of the bowel preparation                        was excellent. Findings:      A diminutive polyp was found in the transverse colon. The polyp was       sessile. The polyp was removed with a jumbo cold forceps. Resection and       retrieval were complete.  A small polyp was found in the proximal ascending colon. The polyp was       sessile. The polyp was removed with a hot snare.  Resection and retrieval       were complete. To prevent bleeding after the polypectomy, one hemostatic       clip was successfully placed. There was no bleeding at the end of the       procedure.      A diminutive polyp was found in the rectum. The polyp was sessile. The       polyp was removed with a hot snare. Resection and retrieval were       complete.      Internal hemorrhoids were found during endoscopy. The hemorrhoids were       small and Grade I (internal hemorrhoids that do not prolapse).      The exam was otherwise without abnormality. Impression:           - One diminutive polyp in the transverse colon, removed                        with a jumbo cold forceps. Resected and retrieved.                       - One small polyp in the proximal ascending colon,                        removed with a hot snare. Resected and retrieved. Clip                        was placed.                       - One diminutive polyp in the rectum, removed with a                        hot snare. Resected and retrieved.                       - Internal hemorrhoids.                       - The examination was otherwise normal. Recommendation:       - Await pathology results. Manya Silvas, MD 11/10/2017 8:11:18 AM This report has been signed electronically. Number of Addenda: 0 Note Initiated On: 11/10/2017 7:15 AM Scope Withdrawal Time: 0 hours 17 minutes 42 seconds  Total Procedure Duration: 0 hours 29 minutes 37 seconds       [facility]

## 2017-11-10 NOTE — Anesthesia Post-op Follow-up Note (Signed)
Anesthesia QCDR form completed.        

## 2017-11-11 ENCOUNTER — Encounter: Payer: Self-pay | Admitting: Unknown Physician Specialty

## 2017-11-11 LAB — SURGICAL PATHOLOGY

## 2018-01-03 ENCOUNTER — Other Ambulatory Visit: Payer: Self-pay | Admitting: Obstetrics and Gynecology

## 2018-01-03 DIAGNOSIS — Z1231 Encounter for screening mammogram for malignant neoplasm of breast: Secondary | ICD-10-CM

## 2018-01-14 ENCOUNTER — Other Ambulatory Visit: Payer: Self-pay | Admitting: Internal Medicine

## 2018-01-14 DIAGNOSIS — R319 Hematuria, unspecified: Secondary | ICD-10-CM

## 2018-01-18 ENCOUNTER — Other Ambulatory Visit: Payer: Self-pay | Admitting: Internal Medicine

## 2018-01-18 DIAGNOSIS — R319 Hematuria, unspecified: Secondary | ICD-10-CM

## 2018-01-24 ENCOUNTER — Ambulatory Visit: Payer: Managed Care, Other (non HMO)

## 2018-01-25 ENCOUNTER — Ambulatory Visit
Admission: RE | Admit: 2018-01-25 | Discharge: 2018-01-25 | Disposition: A | Discharge status: Home / Self Care | Payer: Managed Care, Other (non HMO) | Source: Ambulatory Visit | Attending: Internal Medicine | Admitting: Internal Medicine

## 2018-01-25 DIAGNOSIS — R319 Hematuria, unspecified: Secondary | ICD-10-CM | POA: Insufficient documentation

## 2018-02-14 ENCOUNTER — Ambulatory Visit
Admission: RE | Admit: 2018-02-14 | Discharge: 2018-02-14 | Disposition: A | Discharge status: Home / Self Care | Payer: Managed Care, Other (non HMO) | Source: Ambulatory Visit | Attending: Obstetrics and Gynecology | Admitting: Obstetrics and Gynecology

## 2018-02-14 DIAGNOSIS — Z1231 Encounter for screening mammogram for malignant neoplasm of breast: Secondary | ICD-10-CM | POA: Insufficient documentation

## 2019-01-24 ENCOUNTER — Other Ambulatory Visit: Payer: Self-pay | Admitting: Obstetrics and Gynecology

## 2019-01-24 ENCOUNTER — Other Ambulatory Visit: Payer: Self-pay | Admitting: Internal Medicine

## 2019-01-24 DIAGNOSIS — Z1231 Encounter for screening mammogram for malignant neoplasm of breast: Secondary | ICD-10-CM

## 2019-02-17 ENCOUNTER — Ambulatory Visit
Admission: RE | Admit: 2019-02-17 | Discharge: 2019-02-17 | Disposition: A | Discharge status: Home / Self Care | Payer: Medicare Other | Source: Ambulatory Visit | Attending: Obstetrics and Gynecology | Admitting: Obstetrics and Gynecology

## 2019-02-17 ENCOUNTER — Other Ambulatory Visit: Payer: Self-pay

## 2019-02-17 DIAGNOSIS — Z1231 Encounter for screening mammogram for malignant neoplasm of breast: Secondary | ICD-10-CM | POA: Diagnosis present

## 2019-04-04 ENCOUNTER — Other Ambulatory Visit (HOSPITAL_COMMUNITY)
Admission: RE | Admit: 2019-04-04 | Discharge: 2019-04-04 | Disposition: A | Discharge status: Home / Self Care | Payer: Medicare Other | Source: Ambulatory Visit | Attending: Obstetrics and Gynecology | Admitting: Obstetrics and Gynecology

## 2019-04-04 ENCOUNTER — Ambulatory Visit (INDEPENDENT_AMBULATORY_CARE_PROVIDER_SITE_OTHER): Payer: Medicare Other | Admitting: Obstetrics and Gynecology

## 2019-04-04 ENCOUNTER — Other Ambulatory Visit: Payer: Self-pay

## 2019-04-04 ENCOUNTER — Encounter: Payer: Self-pay | Admitting: Obstetrics and Gynecology

## 2019-04-04 VITALS — BP 110/80 | Ht 63.0 in | Wt 169.0 lb

## 2019-04-04 DIAGNOSIS — N898 Other specified noninflammatory disorders of vagina: Secondary | ICD-10-CM

## 2019-04-04 DIAGNOSIS — N952 Postmenopausal atrophic vaginitis: Secondary | ICD-10-CM | POA: Diagnosis not present

## 2019-04-04 DIAGNOSIS — Z124 Encounter for screening for malignant neoplasm of cervix: Secondary | ICD-10-CM

## 2019-04-04 DIAGNOSIS — N941 Unspecified dyspareunia: Secondary | ICD-10-CM

## 2019-04-04 MED ORDER — CLOBETASOL PROPIONATE 0.05 % EX OINT: TOPICAL_OINTMENT | CUTANEOUS | 0 refills | Status: AC

## 2019-04-04 MED ORDER — PREMARIN 0.625 MG/GM VA CREA: TOPICAL_CREAM | VAGINAL | 0 refills | Status: DC

## 2019-04-04 NOTE — Progress Notes (Signed)
Brenda Hector, MD   Chief Complaint  Patient presents with  . Vaginal Itching    dryness, no discharge or odor     HPI:      [name] is a 64 y.o. G2P1011 who LMP was No LMP recorded. Patient is postmenopausal., presents today for NP > 3 yrs eval for vaginal dryness, itching and severe dyspareunia.  Menopause age 16, did HRT for about 6 months. Pt with vaginal dryness and dyspareunia. Tried prem vag crm and estriol supp without relief. Hasn't been able to be sex active in a year due to vaginal pain with sex. Dr. Ouida Sills said exam was difficult due to atrophy/small vag opening. Pt doesn't want HRT. Was referred to pelvic PT in past. Also has vaginal dilators per note 2017.  Pt also has severe vaginal itching. Treated with betamethasone crm with a little improvement. Pt states she had bx vaginally in past (can't find in past notes) and doesn't have hx of LS. Uses sens skin soap, no dryer sheets, no wipes, bath bombs, etc. Has looked into "revaree" (hyaluronic acid OTC) for vaginal dryness.  Last annual at [facility] 1/21. Neg pap/neg HPV DNA 2019 at [facility]. He was unable to do pap this yr on exam. Pt wants today if possible.  FH ovar cancer in her mat aunt. Pt doesn't meet medicare genetic testing guidelines.  Past Medical History:  Diagnosis Date  . Anxiety   . Aortic atherosclerosis ([place])   . B12 deficiency   . Complication of anesthesia    slow to wake up with one surgery years ago  . Depression   . Hashimoto's thyroiditis   . Hyperlipemia   . Hypothyroidism   . Near syncope    a. 08/2009 Holter - PAC's, PVC's.  . OSA (obstructive sleep apnea)    a. uses CPAP.  Marland Kitchen Osteopenia   . Osteopenia after menopause   . Overactive bladder   . Pernicious anemia   . 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 Daily Acyclovir  . Retinitis pigmentosa   . Retinitis pigmentosa   . Shingles   . Vitamin D deficiency   . Vulvitis     Past Surgical History:    Procedure Laterality Date  . BREAST CYST ASPIRATION Right 2014  . CATARACT EXTRACTION    . CESAREAN SECTION    . COLONOSCOPY     a. 10/2007 - Internal hemorrohoids noted, otw nl.  . COLONOSCOPY WITH PROPOFOL N/A 11/10/2017   Procedure: COLONOSCOPY WITH PROPOFOL;  Surgeon: Manya Silvas, MD;  Location: [facility] ENDOSCOPY;  Service: Endoscopy;  Laterality: N/A;  . CYST REMOVAL NECK    . LEFT HEART CATH AND CORONARY ANGIOGRAPHY N/A 03/13/2016   Procedure: Left Heart Cath and Coronary Angiography;  Surgeon: Wellington Hampshire, MD;  Location: [place] [facility];  Service: Cardiovascular;  Laterality: N/A;  . TONSILLECTOMY    . TUBAL LIGATION    . UPPER GI ENDOSCOPY     a. 12/2011 Benign polyps.    Family History  Problem Relation Age of Onset  . Breast cancer Maternal Aunt        great  . Diabetes Mother   . Osteoarthritis Mother   . Lung cancer Father   . Hypertension Father     Social History   Socioeconomic History  . Marital status: Married    Spouse name: Not on file  . Number of children: Not on file  .  Years of education: Not on file  . Highest education level: Not on file  Occupational History  . Not on file  Tobacco Use  . Smoking status: Former Smoker    Packs/day: 1.00    Years: 15.00    Pack years: 15.00  . Smokeless tobacco: Never Used  . Tobacco comment: quit at age 18.  Substance and Sexual Activity  . Alcohol use: No    Alcohol/week: 0.0 standard drinks  . Drug use: No  . Sexual activity: Not Currently    Birth control/protection: Post-menopausal  Other Topics Concern  . Not on file  Social History Narrative   Lives in [place] w/ husband.  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  Does not routinely exercise.   Social Determinants of Health   Financial Resource Strain:   . Difficulty of Paying Living Expenses: Not on file  Food Insecurity:   . Worried About Charity fundraiser in the Last Year: Not on file  . Ran  Out of Food in the Last Year: Not on file  Transportation Needs:   . Lack of Transportation (Medical): Not on file  . Lack of Transportation (Non-Medical): Not on file  Physical Activity:   . Days of Exercise per Week: Not on file  . Minutes of Exercise per Session: Not on file  Stress:   . Feeling of Stress : Not on file  Social Connections:   . Frequency of Communication with Friends and Family: Not on file  . Frequency of Social Gatherings with Friends and Family: Not on file  . Attends Religious Services: Not on file  . Active Member of Clubs or Organizations: Not on file  . Attends Archivist Meetings: Not on file  . Marital Status: Not on file  Intimate Partner Violence:   . Fear of Current or Ex-Partner: Not on file  . Emotionally Abused: Not on file  . Physically Abused: Not on file  . Sexually Abused: Not on file    Outpatient Medications Prior to Visit  Medication Sig Dispense Refill  . acyclovir (ZOVIRAX) 200 MG capsule     . albuterol (VENTOLIN HFA) 108 (90 Base) MCG/ACT inhaler Inhale into the lungs.    Delma Freeze ALLERGY 60 MG tablet     . ALPRAZolam (XANAX) 0.25 MG tablet Take 0.25 mg by mouth daily.     Marland Kitchen aspirin 81 MG chewable tablet Chew 81 mg by mouth daily.    . betamethasone valerate (VALISONE) 0.1 % cream Apply topically 2 (two) times daily as needed.     . cyanocobalamin (,VITAMIN B-12,) 1000 MCG/ML injection Inject 1,000 mcg into the muscle every 30 (thirty) days.    Marland Kitchen FLUoxetine (PROZAC) 20 MG capsule SMARTSIG:3 Capsule(s) By Mouth Every Other Day PRN    . fluticasone (FLONASE) 50 MCG/ACT nasal spray 1-2 sprays in each nostril daily (Patient taking differently: as needed. 1-2 sprays in each nostril daily) 16 g 0  . Multiple Vitamin (MULTIVITAMIN) tablet Take 1 tablet by mouth daily.    Marland Kitchen thyroid (ARMOUR THYROID) 30 MG tablet Take by mouth.    . vitamin A 10000 UNIT capsule Take 10,000 Units by mouth daily.    . Vitamin D, Ergocalciferol,  (DRISDOL) 50000 units CAPS capsule Take 50,000 Units by mouth once a week.    Marland Kitchen acyclovir (ZOVIRAX) 200 MG/5ML suspension Take 200 mg by mouth daily.     Marland Kitchen FLUoxetine (PROZAC) 40 MG capsule Take 40 mg by  mouth daily.     . sodium chloride (OCEAN) 0.65 % SOLN nasal spray Place 1 spray into both nostrils as needed for congestion.  0   No facility-administered medications prior to visit.      ROS:  Review of Systems  Constitutional: Negative for fever.  Gastrointestinal: Negative for blood in stool, constipation, diarrhea, nausea and vomiting.  Genitourinary: Positive for dyspareunia and vaginal pain. Negative for dysuria, flank pain, frequency, hematuria, urgency, vaginal bleeding and vaginal discharge.  Musculoskeletal: Negative for back pain.  Skin: Negative for rash.  BREAST: No symptoms   OBJECTIVE:   Vitals:  BP 110/80   Ht 5\' 3"  (1.6 m)   Wt 169 lb (76.7 kg)   BMI 29.94 kg/m   Physical Exam Vitals reviewed.  Constitutional:      Appearance: She is well-developed.  Pulmonary:     Effort: Pulmonary effort is normal.  Genitourinary:    General: Normal vulva.     Pubic Area: No rash.      Labia:        Right: No rash, tenderness or lesion.        Left: No rash, tenderness or lesion.      Vagina: Normal. No vaginal discharge, erythema or tenderness.     Cervix: Normal.     Uterus: Normal. Not enlarged and not tender.      Adnexa: Right adnexa normal and left adnexa normal.       Right: No mass or tenderness.         Left: No mass or tenderness.         Comments: MOD VAG ATROPHY WITH NARROWED VAG OPENING Musculoskeletal:        General: Normal range of motion.     Cervical back: Normal range of motion.  Skin:    General: Skin is warm and dry.  Neurological:     General: No focal deficit present.     Mental Status: She is alert and oriented to person, place, and time.  Psychiatric:        Mood and Affect: Mood normal.        Behavior: Behavior normal.         Thought Content: Thought content normal.        Judgment: Judgment normal.    [facility] ANNUAL NOTES REVIEWED FROM 2017-2021.  Assessment/Plan: Dyspareunia in female - Plan: conjugated estrogens (PREMARIN) vaginal cream; Vag atrophy on exam. Discussed starting with vag ERT (not getting systemic hormones), trying lubricants, etc. Also treat for possible LS to see if that improves sx too. Also discussed intrarosa, osphena, and controversial Therma Vi (pt can research). F/u in 4 wks.   Vaginal atrophy - Plan: conjugated estrogens (PREMARIN) vaginal cream  Vaginal itching - Plan: clobetasol ointment (TEMOVATE) 0.05 %; Suspicious for LS. Pt states had bx in past. Trial of clobetasol. RTO in 4 wks for sx f/u. May improve dyspareunia as well.   Cervical cancer screening - Plan: Cytology - PAP; Plain pap per Medicare  Discussed that pt has 2 different vaginal issues going on that need to be treated separately. We may get sx relief of the itching but the vaginal tissue may not be able to be restored enough for sex. Pt understands.    Meds ordered this encounter  Medications  . clobetasol ointment (TEMOVATE) 0.05 %    Sig: Apply to affected area every day for 4 weeks, then every other day for 4 weeks and then twice a week for  4 weeks or until resolution.    Dispense:  30 g    Refill:  0    Order Specific Question:   Supervising Provider    Answer:   Gae Dry [id number]  . conjugated estrogens (PREMARIN) vaginal cream    Sig: Insert 1 g nightly for 7 days (total), then once weekly as maintenance    Dispense:  30 g    Refill:  0    Order Specific Question:   Supervising Provider    Answer:   Gae Dry [id number]      Return in about 4 weeks (around 05/02/2019) for GYN f/u.  Mandisa Persinger B. Edyn Qazi, PA-C 04/04/2019 4:13 PM

## 2019-04-04 NOTE — Patient Instructions (Signed)
I value your feedback and entrusting us with your care. If you get a [place] patient survey, I would appreciate you taking the time to let us know about your experience today. Thank you!  As of January 05, 2019, your lab results will be released to your MyChart immediately, before I even have a chance to see them. Please give me time to review them and contact you if there are any abnormalities. Thank you for your patience.  

## 2019-04-07 LAB — CYTOLOGY - PAP
Comment: NEGATIVE
Diagnosis: NEGATIVE
High risk HPV: NEGATIVE

## 2019-04-27 ENCOUNTER — Ambulatory Visit: Payer: Medicare Other | Admitting: Obstetrics and Gynecology

## 2019-04-27 NOTE — Progress Notes (Deleted)
[name], MD   No chief complaint on file.   HPI:      Brenda Oneill is a 64 y.o. G2P1011 who LMP was No LMP recorded. Patient is postmenopausal., presents today for vaginal itching and atrophy f/u.    Past Medical History:  Diagnosis Date  . Anxiety   . Aortic atherosclerosis ([place])   . B12 deficiency   . Complication of anesthesia    slow to wake up with one surgery years ago  . Depression   . Hashimoto's thyroiditis   . Hyperlipemia   . Hypothyroidism   . Near syncope    a. 08/2009 Holter - PAC's, PVC's.  . OSA (obstructive sleep apnea)    a. uses CPAP.  Marland Kitchen Osteopenia   . Osteopenia after menopause   . Overactive bladder   . Pernicious anemia   . 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 Daily Acyclovir  . Retinitis pigmentosa   . Retinitis pigmentosa   . Shingles   . Vitamin D deficiency   . Vulvitis     Past Surgical History:  Procedure Laterality Date  . BREAST CYST ASPIRATION Right 2014  . CATARACT EXTRACTION    . CESAREAN SECTION    . COLONOSCOPY     a. 10/2007 - Internal hemorrohoids noted, otw nl.  . COLONOSCOPY WITH PROPOFOL N/A 11/10/2017   Procedure: COLONOSCOPY WITH PROPOFOL;  Surgeon: Manya Silvas, MD;  Location: [facility];  Service: Endoscopy;  Laterality: N/A;  . CYST REMOVAL NECK    . LEFT HEART CATH AND CORONARY ANGIOGRAPHY N/A 03/13/2016   Procedure: Left Heart Cath and Coronary Angiography;  Surgeon: Wellington Hampshire, MD;  Location: [facility];  Service: Cardiovascular;  Laterality: N/A;  . TONSILLECTOMY    . TUBAL LIGATION    . UPPER GI ENDOSCOPY     a. 12/2011 Benign polyps.    Family History  Problem Relation Age of Onset  . Breast cancer Maternal Aunt        great  . Diabetes Mother   . Osteoarthritis Mother   . Lung cancer Father   . Hypertension Father     Social History   Socioeconomic History  . Marital status: Married    Spouse name: Not on file  . Number of  children: Not on file  . Years of education: Not on file  . Highest education level: Not on file  Occupational History  . Not on file  Tobacco Use  . Smoking status: Former Smoker    Packs/day: 1.00    Years: 15.00    Pack years: 15.00  . Smokeless tobacco: Never Used  . Tobacco comment: quit at age 40.  Substance and Sexual Activity  . Alcohol use: No    Alcohol/week: 0.0 standard drinks  . Drug use: No  . Sexual activity: Not Currently    Birth control/protection: Post-menopausal  Other Topics Concern  . Not on file  Social History Narrative   Lives in [place] w/ husband.  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  Does not routinely exercise.   Social Determinants of Health   Financial Resource Strain:   . Difficulty of Paying Living Expenses:   Food Insecurity:   . Worried About Charity fundraiser in the Last Year:   . Arboriculturist in the Last Year:   Transportation Needs:   . Film/video editor (Medical):   Marland Kitchen  Lack of Transportation (Non-Medical):   Physical Activity:   . Days of Exercise per Week:   . Minutes of Exercise per Session:   Stress:   . Feeling of Stress :   Social Connections:   . Frequency of Communication with Friends and Family:   . Frequency of Social Gatherings with Friends and Family:   . Attends Religious Services:   . Active Member of Clubs or Organizations:   . Attends Archivist Meetings:   Marland Kitchen Marital Status:   Intimate Partner Violence:   . Fear of Current or Ex-Partner:   . Emotionally Abused:   Marland Kitchen Physically Abused:   . Sexually Abused:     Outpatient Medications Prior to Visit  Medication Sig Dispense Refill  . acyclovir (ZOVIRAX) 200 MG capsule     . albuterol (VENTOLIN HFA) 108 (90 Base) MCG/ACT inhaler Inhale into the lungs.    Delma Freeze ALLERGY 60 MG tablet     . ALPRAZolam (XANAX) 0.25 MG tablet Take 0.25 mg by mouth daily.     Marland Kitchen aspirin 81 MG chewable tablet Chew 81 mg by mouth  daily.    . betamethasone valerate (VALISONE) 0.1 % cream Apply topically 2 (two) times daily as needed.     . clobetasol ointment (TEMOVATE) 0.05 % Apply to affected area every day for 4 weeks, then every other day for 4 weeks and then twice a week for 4 weeks or until resolution. 30 g 0  . conjugated estrogens (PREMARIN) vaginal cream Insert 1 g nightly for 7 days (total), then once weekly as maintenance 30 g 0  . cyanocobalamin (,VITAMIN B-12,) 1000 MCG/ML injection Inject 1,000 mcg into the muscle every 30 (thirty) days.    Marland Kitchen FLUoxetine (PROZAC) 20 MG capsule SMARTSIG:3 Capsule(s) By Mouth Every Other Day PRN    . fluticasone (FLONASE) 50 MCG/ACT nasal spray 1-2 sprays in each nostril daily (Patient taking differently: as needed. 1-2 sprays in each nostril daily) 16 g 0  . Multiple Vitamin (MULTIVITAMIN) tablet Take 1 tablet by mouth daily.    Marland Kitchen thyroid (ARMOUR THYROID) 30 MG tablet Take by mouth.    . vitamin A 10000 UNIT capsule Take 10,000 Units by mouth daily.    . Vitamin D, Ergocalciferol, (DRISDOL) 50000 units CAPS capsule Take 50,000 Units by mouth once a week.     No facility-administered medications prior to visit.      ROS:  Review of Systems BREAST: No symptoms   OBJECTIVE:   Vitals:  There were no vitals taken for this visit.  Physical Exam  Results: No results found for this or any previous visit (from the past 24 hour(s)).   Assessment/Plan: No diagnosis found.    No orders of the defined types were placed in this encounter.     No follow-ups on file.  Destyn Parfitt B. Gilmar Bua, PA-C 04/27/2019 4:21 PM

## 2019-08-08 ENCOUNTER — Ambulatory Visit (INDEPENDENT_AMBULATORY_CARE_PROVIDER_SITE_OTHER): Payer: Medicare Other | Admitting: Dermatology

## 2019-08-08 ENCOUNTER — Other Ambulatory Visit: Payer: Self-pay

## 2019-08-08 DIAGNOSIS — L309 Dermatitis, unspecified: Secondary | ICD-10-CM

## 2019-08-08 DIAGNOSIS — L814 Other melanin hyperpigmentation: Secondary | ICD-10-CM | POA: Diagnosis not present

## 2019-08-08 DIAGNOSIS — D229 Melanocytic nevi, unspecified: Secondary | ICD-10-CM

## 2019-08-08 DIAGNOSIS — Z86018 Personal history of other benign neoplasm: Secondary | ICD-10-CM

## 2019-08-08 DIAGNOSIS — Z1283 Encounter for screening for malignant neoplasm of skin: Secondary | ICD-10-CM | POA: Diagnosis not present

## 2019-08-08 DIAGNOSIS — L821 Other seborrheic keratosis: Secondary | ICD-10-CM

## 2019-08-08 DIAGNOSIS — D692 Other nonthrombocytopenic purpura: Secondary | ICD-10-CM

## 2019-08-08 DIAGNOSIS — D1800 Hemangioma unspecified site: Secondary | ICD-10-CM

## 2019-08-08 DIAGNOSIS — L578 Other skin changes due to chronic exposure to nonionizing radiation: Secondary | ICD-10-CM

## 2019-08-08 NOTE — Progress Notes (Signed)
   Follow-Up Visit   Subjective  Brenda Oneill is a 64 y.o. female who presents for the following: Annual Exam.  Patient here today for TBSE. 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 Patient advises nothing new or changing that she is aware of. She has a h/o periocular dermatitis which cleared up with Tacrolimus ointment.  She has patch test and is allergic to black rubber mix and Gold.  The following portions of the chart were reviewed this encounter and updated as appropriate:      Review of Systems:  No other skin or systemic complaints except as noted in HPI or Assessment and Plan.  Objective  Well appearing patient in no apparent distress; mood and affect are within normal limits.  A full examination was performed including scalp, head, eyes, ears, nose, lips, neck, chest, axillae, abdomen, back, buttocks, bilateral upper extremities, bilateral lower extremities, hands, feet, fingers, toes, fingernails, and toenails. All findings within normal limits unless otherwise noted below.  Objective  Periocular: Clear today  Objective  Right Forearm: Violaceous macules and patches.    Assessment & Plan  Dermatitis Periocular  Resolved Continue tacrolimus ointment qd/bid as needed. Pt has  Senile purpura (HCC) Right Forearm  - Benign - Related to age, sun damage and/or use of blood thinners - Observe - Can use OTC arnica containing moisturizer such as Dermend Bruise Formula if desired - Call for worsening or other concerns      Lentigines - Scattered tan macules - Discussed due to sun exposure - Benign, observe - Call for any changes  Seborrheic Keratoses - Stuck-on, waxy, tan-brown papules and plaques  - Discussed benign etiology and prognosis. - Observe - Call for any changes  Melanocytic Nevi - Tan-brown and/or pink-flesh-colored symmetric macules and papules - Benign appearing on exam today - Observation - Call clinic for new or changing moles -  Recommend daily use of broad spectrum spf 30+ sunscreen to sun-exposed areas.   Hemangiomas - Red papules - Discussed benign nature - Observe - Call for any changes  Actinic Damage - diffuse scaly erythematous macules with underlying dyspigmentation - Recommend daily broad spectrum sunscreen SPF 30+ to sun-exposed areas, reapply every 2 hours as needed.  - Call for new or changing lesions.  Skin cancer screening performed today.  History of Dysplastic Nevi - No evidence of recurrence today at right upper arm, right upper earlobe, right post thigh inf buttock - Recommend regular full body skin exams - Recommend daily broad spectrum sunscreen SPF 30+ to sun-exposed areas, reapply every 2 hours as needed.  - Call if any new or changing lesions are noted between office visits  Return in about 1 year (around 08/07/2020) for TBSE.  Graciella Belton, RMA, am acting as scribe for Brendolyn Patty, MD . Documentation: I have reviewed the above documentation for accuracy and completeness, and I agree with the above.  Brendolyn Patty MD

## 2019-08-08 NOTE — Patient Instructions (Addendum)

## 2020-01-17 ENCOUNTER — Other Ambulatory Visit: Payer: Self-pay | Admitting: Obstetrics and Gynecology

## 2020-01-17 DIAGNOSIS — Z1231 Encounter for screening mammogram for malignant neoplasm of breast: Secondary | ICD-10-CM

## 2020-02-23 ENCOUNTER — Other Ambulatory Visit: Payer: Self-pay

## 2020-02-23 ENCOUNTER — Ambulatory Visit
Admission: RE | Admit: 2020-02-23 | Discharge: 2020-02-23 | Disposition: A | Discharge status: Home / Self Care | Payer: Medicare Other | Source: Ambulatory Visit | Attending: Obstetrics and Gynecology | Admitting: Obstetrics and Gynecology

## 2020-02-23 DIAGNOSIS — Z1231 Encounter for screening mammogram for malignant neoplasm of breast: Secondary | ICD-10-CM | POA: Insufficient documentation

## 2020-08-27 ENCOUNTER — Ambulatory Visit (INDEPENDENT_AMBULATORY_CARE_PROVIDER_SITE_OTHER): Payer: Medicare Other | Admitting: Dermatology

## 2020-08-27 ENCOUNTER — Other Ambulatory Visit: Payer: Self-pay

## 2020-08-27 DIAGNOSIS — L309 Dermatitis, unspecified: Secondary | ICD-10-CM

## 2020-08-27 DIAGNOSIS — Z1283 Encounter for screening for malignant neoplasm of skin: Secondary | ICD-10-CM

## 2020-08-27 DIAGNOSIS — D229 Melanocytic nevi, unspecified: Secondary | ICD-10-CM

## 2020-08-27 DIAGNOSIS — L821 Other seborrheic keratosis: Secondary | ICD-10-CM

## 2020-08-27 DIAGNOSIS — D2371 Other benign neoplasm of skin of right lower limb, including hip: Secondary | ICD-10-CM

## 2020-08-27 DIAGNOSIS — L578 Other skin changes due to chronic exposure to nonionizing radiation: Secondary | ICD-10-CM

## 2020-08-27 DIAGNOSIS — L814 Other melanin hyperpigmentation: Secondary | ICD-10-CM

## 2020-08-27 DIAGNOSIS — Z86018 Personal history of other benign neoplasm: Secondary | ICD-10-CM

## 2020-08-27 NOTE — Patient Instructions (Signed)

## 2020-08-27 NOTE — Progress Notes (Signed)
   Follow-Up Visit   Subjective  Brenda Oneill is a 65 y.o. female who presents for the following: Annual Exam (Patient presents for TBSE. She has 2 spots on her right thigh she would like checked. They are not bothersome to her, but they have a slight roughness to them. She has a history of atypical nevi. ).   The following portions of the chart were reviewed this encounter and updated as appropriate:       Review of Systems:  No other skin or systemic complaints except as noted in HPI or Assessment and Plan.  Objective  Well appearing patient in no apparent distress; mood and affect are within normal limits.  A full examination was performed including scalp, head, eyes, ears, nose, lips, neck, chest, axillae, abdomen, back, buttocks, bilateral upper extremities, bilateral lower extremities, hands, feet, fingers, toes, fingernails, and toenails. All findings within normal limits unless otherwise noted below.  Periocular Clear today.   Assessment & Plan  Skin cancer screening performed today.  Actinic Damage - chronic, secondary to cumulative UV radiation exposure/sun exposure over time - diffuse scaly erythematous macules with underlying dyspigmentation - Recommend daily broad spectrum sunscreen SPF 30+ to sun-exposed areas, reapply every 2 hours as needed.  - Recommend staying in the shade or wearing long sleeves, sun glasses (UVA+UVB protection) and wide brim hats (4-inch brim around the entire circumference of the hat). - Call for new or changing lesions.  Lentigines - Scattered tan macules - Due to sun exposure - Benign-appering, observe - Recommend daily broad spectrum sunscreen SPF 30+ to sun-exposed areas, reapply every 2 hours as needed. - Call for any changes  Seborrheic Keratoses - Stuck-on, waxy, tan-brown macules/papules, including right anterior thigh  - Benign-appearing - Discussed benign etiology and prognosis. - Observe - Call for any  changes  Dermatofibroma - Firm pink/brown papulenodule with dimple sign of the right medial thigh - Benign appearing - Call for any changes  Melanocytic Nevi - Tan-brown and/or pink-flesh-colored symmetric macules and papules - Benign appearing on exam today - Observation - Call clinic for new or changing moles - Recommend daily use of broad spectrum spf 30+ sunscreen to sun-exposed areas.   Dermatitis Periocular  Hx of Periocular Dermatitis - well controlled.  Continue tacrolimus 0.1% ointment qd/bid AA prn flares. Pt will call for refills.  History of Dysplastic Nevi - No evidence of recurrence today - Recommend regular full body skin exams - Recommend daily broad spectrum sunscreen SPF 30+ to sun-exposed areas, reapply every 2 hours as needed.  - Call if any new or changing lesions are noted between office visits  Return in about 1 year (around 08/27/2021) for TBSE.  IJamesetta Orleans, CMA, am acting as scribe for Brendolyn Patty, MD .  Documentation: I have reviewed the above documentation for accuracy and completeness, and I agree with the above.  Brendolyn Patty MD

## 2021-05-16 ENCOUNTER — Other Ambulatory Visit: Payer: Self-pay | Admitting: Obstetrics

## 2021-05-16 ENCOUNTER — Other Ambulatory Visit: Payer: Self-pay | Admitting: Internal Medicine

## 2021-05-16 DIAGNOSIS — Z1231 Encounter for screening mammogram for malignant neoplasm of breast: Secondary | ICD-10-CM

## 2021-05-20 ENCOUNTER — Other Ambulatory Visit: Payer: Self-pay | Admitting: Obstetrics

## 2021-05-20 DIAGNOSIS — N644 Mastodynia: Secondary | ICD-10-CM

## 2021-06-04 ENCOUNTER — Ambulatory Visit
Admission: RE | Admit: 2021-06-04 | Discharge: 2021-06-04 | Disposition: A | Discharge status: Home / Self Care | Payer: Medicare HMO | Source: Ambulatory Visit | Attending: Obstetrics | Admitting: Obstetrics

## 2021-06-04 DIAGNOSIS — N644 Mastodynia: Secondary | ICD-10-CM | POA: Insufficient documentation

## 2021-09-09 ENCOUNTER — Encounter: Payer: Medicare Other | Admitting: Dermatology

## 2021-10-08 ENCOUNTER — Ambulatory Visit (INDEPENDENT_AMBULATORY_CARE_PROVIDER_SITE_OTHER): Payer: Medicare HMO | Admitting: Dermatology

## 2021-10-08 DIAGNOSIS — L820 Inflamed seborrheic keratosis: Secondary | ICD-10-CM

## 2021-10-08 DIAGNOSIS — D229 Melanocytic nevi, unspecified: Secondary | ICD-10-CM

## 2021-10-08 DIAGNOSIS — Z86018 Personal history of other benign neoplasm: Secondary | ICD-10-CM

## 2021-10-08 DIAGNOSIS — D1800 Hemangioma unspecified site: Secondary | ICD-10-CM

## 2021-10-08 DIAGNOSIS — L309 Dermatitis, unspecified: Secondary | ICD-10-CM | POA: Diagnosis not present

## 2021-10-08 DIAGNOSIS — L821 Other seborrheic keratosis: Secondary | ICD-10-CM

## 2021-10-08 DIAGNOSIS — Z1283 Encounter for screening for malignant neoplasm of skin: Secondary | ICD-10-CM

## 2021-10-08 DIAGNOSIS — Z808 Family history of malignant neoplasm of other organs or systems: Secondary | ICD-10-CM

## 2021-10-08 DIAGNOSIS — L578 Other skin changes due to chronic exposure to nonionizing radiation: Secondary | ICD-10-CM | POA: Diagnosis not present

## 2021-10-08 DIAGNOSIS — L814 Other melanin hyperpigmentation: Secondary | ICD-10-CM

## 2021-10-08 DIAGNOSIS — D692 Other nonthrombocytopenic purpura: Secondary | ICD-10-CM

## 2021-10-08 MED ORDER — MOMETASONE FUROATE 0.1 % EX CREA: TOPICAL_CREAM | CUTANEOUS | 1 refills | Status: DC

## 2021-10-08 MED ORDER — TACROLIMUS 0.1 % EX OINT: TOPICAL_OINTMENT | CUTANEOUS | 1 refills | Status: DC

## 2021-10-08 NOTE — Patient Instructions (Addendum)
Start mometasone cream - Apply to rash on neck, shoulder twice a day for up to 2 weeks. Avoid face, groin, axilla. If not clear, switch to tacrolimus ointment.  Topical steroids (such as triamcinolone, fluocinolone, fluocinonide, mometasone, clobetasol, halobetasol, betamethasone, hydrocortisone) can cause thinning and lightening of the skin if they are used for too long in the same area. Your physician has selected the right strength medicine for your problem and area affected on the body. Please use your medication only as directed by your physician to prevent side effects.   Continue tacrolimus ointment to rash around eyes once to twice daily as needed.   Seborrheic Keratosis  What causes seborrheic keratoses? Seborrheic keratoses are harmless, common skin growths that first appear during adult life.  As time goes by, more growths appear.  Some people may develop a large number of them.  Seborrheic keratoses appear on both covered and uncovered body parts.  They are not caused by sunlight.  The tendency to develop seborrheic keratoses can be inherited.  They vary in color from skin-colored to gray, brown, or even black.  They can be either smooth or have a rough, warty surface.   Seborrheic keratoses are superficial and look as if they were stuck on the skin.  Under the microscope this type of keratosis looks like layers upon layers of skin.  That is why at times the top layer may seem to fall off, but the rest of the growth remains and re-grows.    Treatment Seborrheic keratoses do not need to be treated, but can easily be removed in the office.  Seborrheic keratoses often cause symptoms when they rub on clothing or jewelry.  Lesions can be in the way of shaving.  If they become inflamed, they can cause itching, soreness, or burning.  Removal of a seborrheic keratosis can be accomplished by freezing, burning, or surgery. If any spot bleeds, scabs, or grows rapidly, please return to have it checked,  as these can be an indication of a skin cancer.  Due to recent changes in healthcare laws, you may see results of your pathology and/or laboratory studies on MyChart before the doctors have had a chance to review them. We understand that in some cases there may be results that are confusing or concerning to you. Please understand that not all results are received at the same time and often the doctors may need to interpret multiple results in order to provide you with the best plan of care or course of treatment. Therefore, we ask that you please give [place] 2 business days to thoroughly review all your results before contacting the office for clarification. Should we see a critical lab result, you will be contacted sooner.   If You Need Anything After Your Visit  If you have any questions or concerns for your doctor, please call our main line at [phone] and press option 4 to reach your doctor's medical assistant. If no one answers, please leave a voicemail as directed and we will return your call as soon as possible. Messages left after 4 pm will be answered the following business day.   You may also send [place] a message via [place]. We typically respond to MyChart messages within 1-2 business days.  For prescription refills, please ask your pharmacy to contact our office. Our fax number is [phone].  If you have an urgent issue when the clinic is closed that cannot wait until the next business day, you can page your doctor at the  number below.    Please note that while we do our best to be available for urgent issues outside of office hours, we are not available 24/7.   If you have an urgent issue and are unable to reach [place], you may choose to seek medical care at your doctor's office, retail clinic, urgent care center, or emergency room.  If you have a medical emergency, please immediately call 911 or go to the emergency department.  Pager Numbers  - Dr. Nehemiah Massed: [phone]  - Dr.  Laurence Ferrari: [phone]  - Dr. Nicole Kindred: [phone]  In the event of inclement weather, please call our main line at [phone] for an update on the status of any delays or closures.  Dermatology Medication Tips: Please keep the boxes that topical medications come in in order to help keep track of the instructions about where and how to use these. Pharmacies typically print the medication instructions only on the boxes and not directly on the medication tubes.   If your medication is too expensive, please contact our office at [phone] option 4 or send [place] a message through [place].   We are unable to tell what your co-pay for medications will be in advance as this is different depending on your insurance coverage. However, we may be able to find a substitute medication at lower cost or fill out paperwork to get insurance to cover a needed medication.   If a prior authorization is required to get your medication covered by your insurance company, please allow [place] 1-2 business days to complete this process.  Drug prices often vary depending on where the prescription is filled and some pharmacies may offer cheaper prices.  The website www.goodrx.com contains coupons for medications through different pharmacies. The prices here do not account for what the cost may be with help from insurance (it may be cheaper with your insurance), but the website can give you the price if you did not use any insurance.  - You can print the associated coupon and take it with your prescription to the pharmacy.  - You may also stop by our office during regular business hours and pick up a GoodRx coupon card.  - If you need your prescription sent electronically to a different pharmacy, notify our office through [facility] or by phone at [phone] option 4.     Si Usted Necesita Algo Despus de Su Visita  Tambin puede enviarnos un mensaje a travs de Pharmacist, community. Por lo general respondemos a los mensajes  de MyChart en el transcurso de 1 a 2 das hbiles.  Para renovar recetas, por favor pida a su farmacia que se ponga en contacto con nuestra oficina. Harland Dingwall de fax es [place] [phone].  Si tiene un asunto urgente cuando la clnica est cerrada y que no puede esperar hasta el siguiente da hbil, puede llamar/localizar a su doctor(a) al nmero que aparece a continuacin.   Por favor, tenga en cuenta que aunque hacemos todo lo posible para estar disponibles para asuntos urgentes fuera del horario de [place], no estamos disponibles las 24 horas del da, los 7 das de la [place].   Si tiene un problema urgente y no puede comunicarse con nosotros, puede optar por buscar atencin mdica  en el consultorio de su doctor(a), en una clnica privada, en un centro de atencin urgente o en una sala de emergencias.  Si tiene Engineering geologist, por favor llame inmediatamente al 911 o vaya a la sala de emergencias.  Nmeros de  bper  - Dr. Nehemiah Massed: [phone]  - Dra. Moye: [phone]  - Dra. Nicole Kindred: [phone]  En caso de inclemencias del [place], por favor llame a Johnsie Kindred principal al [phone] para una actualizacin sobre el [place] de cualquier retraso o cierre.  Consejos para la medicacin en dermatologa: Por favor, guarde las cajas en las que vienen los medicamentos de uso tpico para ayudarle a seguir las instrucciones sobre dnde y cmo usarlos. Las farmacias generalmente imprimen las instrucciones del medicamento slo en las cajas y no directamente en los tubos del [place].   Si su medicamento es muy caro, por favor, pngase en contacto con Zigmund Daniel llamando al [phone] y presione la opcin 4 o envenos un mensaje a travs de Pharmacist, community.   No podemos decirle cul ser su copago por los medicamentos por adelantado ya que esto es diferente dependiendo de la cobertura de su seguro. Sin embargo, es posible que podamos encontrar un medicamento sustituto a [place] un formulario para que el seguro cubra el medicamento que se considera necesario.   Si se requiere una autorizacin previa para que su compaa de seguros [place] su medicamento, por favor permtanos de 1 a 2 das hbiles para completar este proceso.  Los precios de los medicamentos varan con frecuencia dependiendo del Environmental consultant de dnde se surte la receta y alguna farmacias pueden ofrecer precios ms baratos.  El sitio web www.goodrx.com tiene cupones para medicamentos de Airline pilot. Los precios aqu no tienen en cuenta lo que podra costar con la ayuda del seguro (puede ser ms barato con su seguro), pero el sitio web puede darle el precio si no utiliz Research scientist (physical sciences).  - Puede imprimir el cupn correspondiente y llevarlo con su receta a la farmacia.  - Tambin puede pasar por nuestra oficina durante el horario de atencin regular y Charity fundraiser una tarjeta de cupones de GoodRx.  - Si necesita que su receta se enve electrnicamente a una farmacia diferente, informe a nuestra oficina a travs de MyChart de [place] o por telfono llamando al [phone] y presione la opcin 4.

## 2021-10-08 NOTE — Progress Notes (Signed)
Follow-Up Visit   Subjective  Brenda Oneill is a 66 y.o. female who presents for the following: Annual Exam.  The patient presents for Total-Body Skin Exam (TBSE) for skin cancer screening and mole check.  The patient has spots, moles and lesions to be evaluated, some may be new or changing. She has a rough spot on her left thigh. She also has a rash on her right neck that flares about every 2 months, itchy. She uses OTC cream to treat. She has a history of periocular dermatitis and uses tacrolimus ointment as needed. She has had patch testing in the past, allergies to tar and gold. 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 Patient has seasonal allergies. History of dysplastic nevi.    The following portions of the chart were reviewed this encounter and updated as appropriate:       Review of Systems:  No other skin or systemic complaints except as noted in HPI or Assessment and Plan.  Objective  Well appearing patient in no apparent distress; mood and affect are within normal limits.  A full examination was performed including scalp, head, eyes, ears, nose, lips, neck, chest, axillae, abdomen, back, buttocks, bilateral upper extremities, bilateral lower extremities, hands, feet, fingers, toes, fingernails, and toenails. All findings within normal limits unless otherwise noted below.  R ant neck and clavicle, right lateral canthus, right med shoulder Pink scaly patches on the right ant neck and clavicle, right lateral canthus; pink excoriated papules on the right medial shoulder  Left Anterior Thigh flesh waxy thin papule with erythema of the left ant thigh     Assessment & Plan  Skin cancer screening performed today.  Family history of skin cancer - what type(s): unknown - who affected: Father  Actinic Damage - chronic, secondary to cumulative UV radiation exposure/sun exposure over time - diffuse scaly erythematous macules with underlying  dyspigmentation - Recommend daily broad spectrum sunscreen SPF 30+ to sun-exposed areas, reapply every 2 hours as needed.  - Recommend staying in the shade or wearing long sleeves, sun glasses (UVA+UVB protection) and wide brim hats (4-inch brim around the entire circumference of the hat). - Call for new or changing lesions.  Lentigines - Scattered tan macules - Due to sun exposure - Benign-appering, observe - Recommend daily broad spectrum sunscreen SPF 30+ to sun-exposed areas, reapply every 2 hours as needed. - Call for any changes  Seborrheic Keratoses - Stuck-on, waxy, tan-brown papules and/or plaques  - Benign-appearing - Discussed benign etiology and prognosis. - Observe - Call for any changes  Hemangiomas - Red papules - Discussed benign nature - Observe - Call for any changes  Purpura vs SK - Chronic; persistent and recurrent.  Treatable, but not curable. Observation - Light gray/greenish brown macule left upper arm 8.50m - Benign - Related to trauma, age, sun damage and/or use of blood thinners, chronic use of topical and/or oral steroids - Observe - Can use OTC arnica containing moisturizer such as Dermend Bruise Formula if desired - Call for worsening or other concerns  Melanocytic Nevi - Tan-brown and/or pink-flesh-colored symmetric macules and papules - Benign appearing on exam today - Observation - Call clinic for new or changing moles - Recommend daily use of broad spectrum spf 30+ sunscreen to sun-exposed areas.   History of Dysplastic Nevi - No evidence of recurrence today - Recommend regular full body skin exams - Recommend daily broad spectrum sunscreen SPF 30+ to sun-exposed areas, reapply  every 2 hours as needed.  - Call if any new or changing lesions are noted between office visits  Dermatitis R ant neck and clavicle, right lateral canthus, right med shoulder  Possible Atopic, doubt Contact. Patient has had patch testing in the past with  reactions to tar and gold.  Chronic and persistent condition with duration or expected duration over one year. Condition is bothersome/symptomatic for patient. Currently flared.   Atopic dermatitis (eczema) is a chronic, relapsing, pruritic condition that can significantly affect quality of life. It is often associated with allergic rhinitis and/or asthma and can require treatment with topical medications, phototherapy, or in severe cases biologic injectable medication (Dupixent; Adbry) or Oral JAK inhibitors.  Recommend mild soap and moisturizing cream 1-2 times daily.  Gentle skin care handout provided.   Start mometasone cream Apply to rash on neck BID for up to 2 weeks, then switch to tacrolimus if not clear dsp 45g 1Rf. Avoid face, groin, axilla.   Topical steroids (such as triamcinolone, fluocinolone, fluocinonide, mometasone, clobetasol, halobetasol, betamethasone, hydrocortisone) can cause thinning and lightening of the skin if they are used for too long in the same area. Your physician has selected the right strength medicine for your problem and area affected on the body. Please use your medication only as directed by your physician to prevent side effects.   Continue tacrolimus 0.1% ointment to eyelids/neck qd/bid prn rash   mometasone (ELOCON) 0.1 % cream - R ant neck and clavicle, right lateral canthus, right med shoulder Apply to affected area rash on neck twice daily until improved. Avoid face, groin, axilla.  tacrolimus (PROTOPIC) 0.1 % ointment - R ant neck and clavicle, right lateral canthus, right med shoulder Apply to affected areas rash face/body once to twice a day.  Inflamed seborrheic keratosis Left Anterior Thigh  Symptomatic, irritating, patient would like treated.  Destruction of lesion - Left Anterior Thigh  Destruction method: cryotherapy   Informed consent: discussed and consent obtained   Lesion destroyed using liquid nitrogen: Yes   Region frozen until ice  ball extended beyond lesion: Yes   Outcome: patient tolerated procedure well with no complications   Post-procedure details: wound care instructions given   Additional details:  Prior to procedure, discussed risks of blister formation, small wound, skin dyspigmentation, or rare scar following cryotherapy. Recommend Vaseline ointment to treated areas while healing.    Return in about 1 year (around 10/09/2022) for TBSE.  IJamesetta Orleans, CMA, am acting as scribe for Brendolyn Patty, MD .  Documentation: I have reviewed the above documentation for accuracy and completeness, and I agree with the above.  Brendolyn Patty MD

## 2022-05-15 ENCOUNTER — Other Ambulatory Visit: Payer: Self-pay | Admitting: Obstetrics and Gynecology

## 2022-05-15 DIAGNOSIS — Z1231 Encounter for screening mammogram for malignant neoplasm of breast: Secondary | ICD-10-CM

## 2022-06-09 ENCOUNTER — Ambulatory Visit
Admission: RE | Admit: 2022-06-09 | Discharge: 2022-06-09 | Disposition: A | Discharge status: Home / Self Care | Payer: Medicare HMO | Source: Ambulatory Visit | Attending: Obstetrics and Gynecology | Admitting: Obstetrics and Gynecology

## 2022-06-09 DIAGNOSIS — Z1231 Encounter for screening mammogram for malignant neoplasm of breast: Secondary | ICD-10-CM

## 2022-07-25 ENCOUNTER — Other Ambulatory Visit: Payer: Self-pay

## 2022-07-25 ENCOUNTER — Emergency Department
Admission: EM | Admit: 2022-07-25 | Discharge: 2022-07-25 | Disposition: A | Discharge status: Home / Self Care | Payer: Medicare HMO | Attending: Emergency Medicine | Admitting: Emergency Medicine

## 2022-07-25 ENCOUNTER — Emergency Department: Payer: Medicare HMO

## 2022-07-25 DIAGNOSIS — R519 Headache, unspecified: Secondary | ICD-10-CM | POA: Diagnosis present

## 2022-07-25 DIAGNOSIS — E039 Hypothyroidism, unspecified: Secondary | ICD-10-CM | POA: Diagnosis not present

## 2022-07-25 LAB — CBC WITH DIFFERENTIAL/PLATELET
Abs Immature Granulocytes: 0.03 10*3/uL (ref 0.00–0.07)
Basophils Absolute: 0.1 10*3/uL (ref 0.0–0.1)
Basophils Relative: 1 %
Eosinophils Absolute: 0.3 10*3/uL (ref 0.0–0.5)
Eosinophils Relative: 3 %
HCT: 43 % (ref 36.0–46.0)
Hemoglobin: 13.8 g/dL (ref 12.0–15.0)
Immature Granulocytes: 0 %
Lymphocytes Relative: 28 %
Lymphs Abs: 2.6 10*3/uL (ref 0.7–4.0)
MCH: 29.2 pg (ref 26.0–34.0)
MCHC: 32.1 g/dL (ref 30.0–36.0)
MCV: 91.1 fL (ref 80.0–100.0)
Monocytes Absolute: 0.7 10*3/uL (ref 0.1–1.0)
Monocytes Relative: 8 %
Neutro Abs: 5.5 10*3/uL (ref 1.7–7.7)
Neutrophils Relative %: 60 %
Platelets: 245 10*3/uL (ref 150–400)
RBC: 4.72 MIL/uL (ref 3.87–5.11)
RDW: 12.9 % (ref 11.5–15.5)
WBC: 9.1 10*3/uL (ref 4.0–10.5)
nRBC: 0 % (ref 0.0–0.2)

## 2022-07-25 LAB — BASIC METABOLIC PANEL
Anion gap: 9 (ref 5–15)
BUN: 16 mg/dL (ref 8–23)
CO2: 22 mmol/L (ref 22–32)
Calcium: 9.3 mg/dL (ref 8.9–10.3)
Chloride: 106 mmol/L (ref 98–111)
Creatinine, Ser: 0.83 mg/dL (ref 0.44–1.00)
GFR, Estimated: >60 mL/min (ref >60)
Glucose, Bld: 95 mg/dL (ref 70–99)
Potassium: 3.6 mmol/L (ref 3.5–5.1)
Sodium: 137 mmol/L (ref 135–145)

## 2022-07-25 NOTE — ED Triage Notes (Signed)
Pt had new severe headache today and she was concerned for aneurism so called EMS. BP normally runs 105/50 per pt but fire took BP 150/78. Pt on aspirin daily. Took two PRN alprazolam 0.25 mg today.  EMS vitals: BP 130/80 HR 74     O2 95% RA  RR 18

## 2022-07-25 NOTE — ED Provider Notes (Signed)
[facility] Provider Note    Event Date/Time   First MD Initiated Contact with Patient 07/25/22 1814     (approximate)   History   Chief Complaint Headache   HPI  Brenda Oneill is a 67 y.o. female with past medical history of hyperlipidemia, hypothyroidism, and anxiety who presents to the ED complaining of headache.  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  Patient became very upset with this argument, states she had sudden onset of severe headache on the top of her head.  She describes it as a throbbing that made her think she "had an aneurysm."  She denies any history of similar headaches, was feeling well prior to the onset of symptoms with no recent fevers or neck stiffness.  She denies any vision changes, speech changes, numbness or, or weakness.  Headache slightly improved from the time of onset upon her arrival to the ED.  Headache began approximately 1 hour prior to arrival.     Physical Exam   Triage Vital Signs: ED Triage Vitals  Enc Vitals Group     BP 07/25/22 1815 (!) 110/92     Pulse Rate 07/25/22 1815 69     Resp 07/25/22 1815 13     Temp --      Temp src --      SpO2 07/25/22 1815 99 %     Weight 07/25/22 1816 165 lb (74.8 kg)     Height 07/25/22 1816 5\' 3"  (1.6 m)     Head Circumference --      Peak Flow --      Pain Score 07/25/22 1815 4     Pain Loc --      Pain Edu? --      Excl. in GC? --     Most recent vital signs: Vitals:   07/25/22 2000 07/25/22 2015  BP: 138/71   Pulse: 61 65  Resp: 16 15  SpO2: 100% 98%    Constitutional: Alert and oriented. Eyes: Conjunctivae are normal.  Pupils equal, round, and reactive to light bilaterally. Head: Atraumatic. Nose: No congestion/rhinnorhea. Mouth/Throat: Mucous membranes are moist.  Neck: Supple with no meningismus. Cardiovascular: Normal rate, regular rhythm.  Grossly normal heart sounds.  2+ radial pulses bilaterally. Respiratory: Normal respiratory effort.  No retractions. Lungs CTAB. Gastrointestinal: Soft and nontender. No distention. Musculoskeletal: No lower extremity tenderness nor edema.  Neurologic:  Normal speech and language. No gross focal neurologic deficits are appreciated.    ED Results / Procedures / Treatments   Labs (all labs ordered are listed, but only abnormal results are displayed) Labs Reviewed  CBC WITH DIFFERENTIAL/PLATELET  BASIC METABOLIC PANEL    RADIOLOGY CT head reviewed and interpreted by me with no hemorrhage or midline shift.  PROCEDURES:  Critical Care performed: No  Procedures   MEDICATIONS ORDERED IN ED: Medications - No data to display   IMPRESSION / MDM / ASSESSMENT AND PLAN / ED COURSE  I reviewed the triage vital signs and the nursing notes.                              67 y.o. female with past medical history of hyperlipidemia, hypothyroidism, and anxiety who presents to the ED complaining of sudden onset severe headache about an hour prior to arrival.  Patient's  presentation is most consistent with acute presentation with potential threat to life or bodily function.  Differential diagnosis includes, but is not limited to, SAH, meningitis, tension headache, migraine headache, anxiety.  Patient well-appearing and in no acute distress, vital signs are unremarkable.  She has no focal neurologic deficits on exam, does state that headache is slightly improved from the time of onset.  She does state that the headache was sudden and severe with no history of headaches, will check CT head for possible SAH.  Labs are also pending at this time, patient declines any pain medication.  CT imaging is negative for acute process, labs are reassuring with no significant anemia, leukocytosis, tract abnormality, or AKI.  On reassessment, patient reports that her headache has resolved.  She is appropriate  for discharge home with PCP follow-up, was counseled to return to the ED for new or worsening symptoms, patient agrees with plan.      FINAL CLINICAL IMPRESSION(S) / ED DIAGNOSES   Final diagnoses:  Acute nonintractable headache, unspecified headache type     Rx / DC Orders   ED Discharge Orders     None        Note:  This document was prepared using Dragon voice recognition software and may include unintentional dictation errors.   Chesley Noon, MD 07/25/22 2034

## 2022-10-26 ENCOUNTER — Ambulatory Visit (INDEPENDENT_AMBULATORY_CARE_PROVIDER_SITE_OTHER): Payer: Medicare HMO | Admitting: Dermatology

## 2022-10-26 DIAGNOSIS — Z7189 Other specified counseling: Secondary | ICD-10-CM

## 2022-10-26 DIAGNOSIS — L578 Other skin changes due to chronic exposure to nonionizing radiation: Secondary | ICD-10-CM | POA: Diagnosis not present

## 2022-10-26 DIAGNOSIS — L570 Actinic keratosis: Secondary | ICD-10-CM | POA: Diagnosis not present

## 2022-10-26 DIAGNOSIS — Z1283 Encounter for screening for malignant neoplasm of skin: Secondary | ICD-10-CM

## 2022-10-26 DIAGNOSIS — D1801 Hemangioma of skin and subcutaneous tissue: Secondary | ICD-10-CM

## 2022-10-26 DIAGNOSIS — L814 Other melanin hyperpigmentation: Secondary | ICD-10-CM

## 2022-10-26 DIAGNOSIS — W908XXA Exposure to other nonionizing radiation, initial encounter: Secondary | ICD-10-CM

## 2022-10-26 DIAGNOSIS — L719 Rosacea, unspecified: Secondary | ICD-10-CM

## 2022-10-26 DIAGNOSIS — Z808 Family history of malignant neoplasm of other organs or systems: Secondary | ICD-10-CM

## 2022-10-26 DIAGNOSIS — Z86018 Personal history of other benign neoplasm: Secondary | ICD-10-CM

## 2022-10-26 DIAGNOSIS — L309 Dermatitis, unspecified: Secondary | ICD-10-CM | POA: Diagnosis not present

## 2022-10-26 DIAGNOSIS — L821 Other seborrheic keratosis: Secondary | ICD-10-CM

## 2022-10-26 DIAGNOSIS — L2089 Other atopic dermatitis: Secondary | ICD-10-CM

## 2022-10-26 DIAGNOSIS — D229 Melanocytic nevi, unspecified: Secondary | ICD-10-CM

## 2022-10-26 NOTE — Progress Notes (Signed)
Follow-Up Visit   Subjective  Brenda Oneill is a 67 y.o. female who presents for the following: Yearly Skin Cancer Screening and Full Body Skin Exam, hx of Dysplastic nevus.   The patient presents for Total-Body Skin Exam (TBSE) for skin cancer screening and mole check. The patient has spots, moles and lesions to be evaluated, some may be new or changing and the patient may have concern these could be cancer.    The following portions of the chart were reviewed this encounter and updated as appropriate: medications, allergies, medical history  Review of Systems:  No other skin or systemic complaints except as noted in HPI or Assessment and Plan.  Objective  Well appearing patient in no apparent distress; mood and affect are within normal limits.  A full examination was performed including scalp, head, eyes, ears, nose, lips, neck, chest, axillae, abdomen, back, buttocks, bilateral upper extremities, bilateral lower extremities, hands, feet, fingers, toes, fingernails, and toenails. All findings within normal limits unless otherwise noted below.   Relevant physical exam findings are noted in the Assessment and Plan.  right malar cheek x 1 Erythematous thin papules/macules with gritty scale.     Assessment & Plan   SKIN CANCER SCREENING PERFORMED TODAY.  ACTINIC DAMAGE - Chronic condition, secondary to cumulative UV/sun exposure - diffuse scaly erythematous macules with underlying dyspigmentation - Recommend daily broad spectrum sunscreen SPF 30+ to sun-exposed areas, reapply every 2 hours as needed.  - Staying in the shade or wearing long sleeves, sun glasses (UVA+UVB protection) and wide brim hats (4-inch brim around the entire circumference of the hat) are also recommended for sun protection.  - Call for new or changing lesions.  LENTIGINES, SEBORRHEIC KERATOSES, HEMANGIOMAS - Benign normal skin lesions - Benign-appearing - Call for any changes  MELANOCYTIC NEVI -  Tan-brown and/or pink-flesh-colored symmetric macules and papules - Benign appearing on exam today - Observation - Call clinic for new or changing moles - Recommend daily use of broad spectrum spf 30+ sunscreen to sun-exposed areas.    Dermatitis Exam: Neck and eyelids- clear today   Possible Atopic dermatitis.  Patient has had patch testing in the past with reactions to tar and gold.  Chronic and persistent condition with duration or expected duration over one year. Clear today    Atopic dermatitis (eczema) is a chronic, relapsing, pruritic condition that can significantly affect quality of life. It is often associated with allergic rhinitis and/or asthma and can require treatment with topical medications, phototherapy, or in severe cases biologic injectable medication (Dupixent; Adbry) or Oral JAK inhibitors.   Recommend mild soap and moisturizing cream 1-2 times daily.  Gentle skin care handout provided.    Continue mometasone cream Apply to rash on neck BID for up to 2 weeks prn flares, then switch to tacrolimus if not clear dsp 45g 1Rf. Avoid face, groin, axilla.   Topical steroids (such as triamcinolone, fluocinolone, fluocinonide, mometasone, clobetasol, halobetasol, betamethasone, hydrocortisone) can cause thinning and lightening of the skin if they are used for too long in the same area. Your physician has selected the right strength medicine for your problem and area affected on the body. Please use your medication only as directed by your physician to prevent side effects.    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   Chronic and persistent condition with duration or  expected duration over one year. Mild.  No Rx treatment at this time   Rosacea is a chronic progressive skin condition usually affecting the face of adults, causing  redness and/or acne bumps. It is treatable but not curable. It sometimes affects the eyes (ocular rosacea) as well. It may respond to topical and/or systemic medication and can flare with stress, sun exposure, alcohol, exercise, topical steroids (including hydrocortisone/cortisone 10) and some foods.  Daily application of broad spectrum spf 30+ sunscreen to face is recommended to reduce flares.   Treatment Plan Counseling for BBL / IPL / Laser and Coordination of Care Discussed the treatment option of Broad Band Light (BBL) /Intense Pulsed Light (IPL)/ Laser for skin discoloration, including brown spots and redness.  Typically we recommend at least 1-3 treatment sessions about 5-8 weeks apart for best results.  Cannot have tanned skin when BBL performed, and regular use of sunscreen/photoprotection is advised after the procedure to help maintain results. The patient's condition may also require "maintenance treatments" in the future.  The fee for BBL / laser treatments is $350 per treatment session for the whole face.  A fee can be quoted for other parts of the body.  Insurance typically does not pay for BBL/laser treatments and therefore the fee is an out-of-pocket cost. Recommend prophylactic valtrex treatment. Once scheduled for procedure, will send Rx in prior to patient's appointment.     Recommend daily broad spectrum sunscreen SPF 30+ to sun-exposed areas, reapply every 2 hours as needed. Call for new or changing lesions.  Staying in the shade or wearing long sleeves, sun glasses (UVA+UVB protection) and wide brim hats (4-inch brim around the entire circumference of the hat) are also recommended for sun protection.     Family history of skin cancer - what type(s): unknown - who affected: Father  History of Dysplastic Nevi - No evidence of recurrence today - Recommend regular full body skin exams - Recommend daily broad spectrum sunscreen SPF 30+ to sun-exposed areas, reapply every 2 hours as  needed.  - Call if any new or changing lesions are noted between office visits  AK (actinic keratosis) right malar cheek x 1  Actinic keratoses are precancerous spots that appear secondary to cumulative UV radiation exposure/sun exposure over time. They are chronic with expected duration over 1 year. A portion of actinic keratoses will progress to squamous cell carcinoma of the skin. It is not possible to reliably predict which spots will progress to skin cancer and so treatment is recommended to prevent development of skin cancer.  Recommend daily broad spectrum sunscreen SPF 30+ to sun-exposed areas, reapply every 2 hours as needed.  Recommend staying in the shade or wearing long sleeves, sun glasses (UVA+UVB protection) and wide brim hats (4-inch brim around the entire circumference of the hat). Call for new or changing lesions.   Destruction of lesion - right malar cheek x 1  Destruction method: cryotherapy   Informed consent: discussed and consent obtained   Lesion destroyed using liquid nitrogen: Yes   Region frozen until ice ball extended beyond lesion: Yes   Outcome: patient tolerated procedure well with no complications   Post-procedure details: wound care instructions given   Additional details:  Prior to procedure, discussed risks of blister formation, small wound, skin dyspigmentation, or rare scar following cryotherapy. Recommend Vaseline ointment to treated areas while healing.    Return in about 1 year (around 10/26/2023) for TBSE, hx of Dysplastic nevus .  Cipriano Bunker, CMA, am  acting as scribe for Willeen Niece, MD .   Documentation: I have reviewed the above documentation for accuracy and completeness, and I agree with the above.  Willeen Niece, MD

## 2022-10-26 NOTE — Patient Instructions (Addendum)

## 2023-05-05 ENCOUNTER — Ambulatory Visit: Admitting: Dermatology

## 2023-05-05 DIAGNOSIS — L820 Inflamed seborrheic keratosis: Secondary | ICD-10-CM

## 2023-05-05 DIAGNOSIS — D1801 Hemangioma of skin and subcutaneous tissue: Secondary | ICD-10-CM

## 2023-05-05 DIAGNOSIS — W908XXA Exposure to other nonionizing radiation, initial encounter: Secondary | ICD-10-CM

## 2023-05-05 DIAGNOSIS — L578 Other skin changes due to chronic exposure to nonionizing radiation: Secondary | ICD-10-CM

## 2023-05-05 NOTE — Progress Notes (Signed)
   Follow-Up Visit   Subjective  Brenda Oneill is a 68 y.o. female who presents for the following: place at center chest present ~1 month, itchy, red, raised, and scaly.   The patient has spots, moles and lesions to be evaluated, some may be new or changing and the patient may have concern these could be cancer.   The following portions of the chart were reviewed this encounter and updated as appropriate: medications, allergies, medical history  Review of Systems:  No other skin or systemic complaints except as noted in HPI or Assessment and Plan.  Objective  Well appearing patient in no apparent distress; mood and affect are within normal limits.  A focused examination was performed of the following areas: Chest   Relevant exam findings are noted in the Assessment and Plan.  Medial chest x1 Stuck on waxy papule with erythema  Assessment & Plan   ACTINIC DAMAGE - chronic, secondary to cumulative UV radiation exposure/sun exposure over time - diffuse scaly erythematous macules with underlying dyspigmentation - Recommend daily broad spectrum sunscreen SPF 30+ to sun-exposed areas, reapply every 2 hours as needed.  - Recommend staying in the shade or wearing long sleeves, sun glasses (UVA+UVB protection) and wide brim hats (4-inch brim around the entire circumference of the hat). - Call for new or changing lesions.   HEMANGIOMA Exam: red papule(s) Discussed benign nature. Recommend observation. Call for changes.   INFLAMED SEBORRHEIC KERATOSIS Medial chest x1 Symptomatic, irritating, patient would like treated. Destruction of lesion - Medial chest x1  Destruction method: cryotherapy   Informed consent: discussed and consent obtained   Lesion destroyed using liquid nitrogen: Yes   Region frozen until ice ball extended beyond lesion: Yes   Outcome: patient tolerated procedure well with no complications   Post-procedure details: wound care instructions given    Additional details:  Prior to procedure, discussed risks of blister formation, small wound, skin dyspigmentation, or rare scar following cryotherapy. Recommend Vaseline ointment to treated areas while healing.   Return for w/ Dr. Roseanne Reno, As scheduled, TBSE.  I, Soundra Pilon, CMA, am acting as scribe for Willeen Niece, MD .   Documentation: I have reviewed the above documentation for accuracy and completeness, and I agree with the above.  Willeen Niece, MD

## 2023-05-05 NOTE — Patient Instructions (Addendum)

## 2023-05-17 ENCOUNTER — Other Ambulatory Visit: Payer: Self-pay | Admitting: Obstetrics and Gynecology

## 2023-05-17 DIAGNOSIS — Z1231 Encounter for screening mammogram for malignant neoplasm of breast: Secondary | ICD-10-CM

## 2023-05-19 ENCOUNTER — Ambulatory Visit

## 2023-05-19 ENCOUNTER — Ambulatory Visit
Admission: RE | Admit: 2023-05-19 | Discharge: 2023-05-19 | Disposition: A | Discharge status: Home / Self Care | Payer: Medicare HMO | Attending: Internal Medicine | Admitting: Internal Medicine

## 2023-05-19 ENCOUNTER — Encounter: Payer: Self-pay | Admitting: Internal Medicine

## 2023-05-19 ENCOUNTER — Encounter
Admission: RE | Disposition: A | Discharge status: Home / Self Care | Payer: Self-pay | Source: Home / Self Care | Attending: Internal Medicine

## 2023-05-19 DIAGNOSIS — Z87891 Personal history of nicotine dependence: Secondary | ICD-10-CM | POA: Insufficient documentation

## 2023-05-19 DIAGNOSIS — Z79890 Hormone replacement therapy (postmenopausal): Secondary | ICD-10-CM | POA: Insufficient documentation

## 2023-05-19 DIAGNOSIS — K5521 Angiodysplasia of colon with hemorrhage: Secondary | ICD-10-CM | POA: Insufficient documentation

## 2023-05-19 DIAGNOSIS — Z1211 Encounter for screening for malignant neoplasm of colon: Secondary | ICD-10-CM | POA: Insufficient documentation

## 2023-05-19 DIAGNOSIS — Z7951 Long term (current) use of inhaled steroids: Secondary | ICD-10-CM | POA: Diagnosis not present

## 2023-05-19 DIAGNOSIS — K5730 Diverticulosis of large intestine without perforation or abscess without bleeding: Secondary | ICD-10-CM | POA: Insufficient documentation

## 2023-05-19 DIAGNOSIS — Z860100 Personal history of colon polyps, unspecified: Secondary | ICD-10-CM | POA: Diagnosis not present

## 2023-05-19 DIAGNOSIS — G4730 Sleep apnea, unspecified: Secondary | ICD-10-CM | POA: Diagnosis not present

## 2023-05-19 DIAGNOSIS — Z79899 Other long term (current) drug therapy: Secondary | ICD-10-CM | POA: Insufficient documentation

## 2023-05-19 HISTORY — PX: COLONOSCOPY WITH PROPOFOL: SHX5780

## 2023-05-19 SURGERY — COLONOSCOPY WITH PROPOFOL
Anesthesia: General

## 2023-05-19 MED ORDER — LIDOCAINE HCL (PF) 1 % IJ SOLN
INTRAMUSCULAR | Status: AC
  Filled 2023-05-19: qty 2

## 2023-05-19 MED ORDER — PROPOFOL 500 MG/50ML IV EMUL
INTRAVENOUS | Status: DC | PRN
  Administered 2023-05-19: 10 mg via INTRAVENOUS
  Administered 2023-05-19 (×2): 20 mg via INTRAVENOUS
  Administered 2023-05-19 (×2): 10 mg via INTRAVENOUS
  Administered 2023-05-19: 30 mg via INTRAVENOUS
  Administered 2023-05-19: 10 mg via INTRAVENOUS
  Administered 2023-05-19: 100 mg via INTRAVENOUS
  Administered 2023-05-19: 10 mg via INTRAVENOUS
  Administered 2023-05-19: 50 mg via INTRAVENOUS

## 2023-05-19 MED ORDER — LIDOCAINE HCL (PF) 2 % IJ SOLN
INTRAMUSCULAR | Status: DC | PRN
  Administered 2023-05-19: 100 mg via INTRADERMAL

## 2023-05-19 MED ORDER — SODIUM CHLORIDE 0.9 % IV SOLN: INTRAVENOUS | Status: DC

## 2023-05-19 NOTE — Transfer of Care (Signed)
 Immediate Anesthesia Transfer of Care Note  Patient: Brenda Oneill  Procedure(s) Performed: COLONOSCOPY WITH PROPOFOL   Patient Location: Endoscopy Unit  Anesthesia Type:General  Level of Consciousness: drowsy  Airway & Oxygen Therapy: Patient Spontanous Breathing  Post-op Assessment: Report given to RN and Post -op Vital signs reviewed and stable  Post vital signs: Reviewed and stable  Last Vitals:  Vitals Value Taken Time  BP 102/56 05/19/23 1009  Temp 36.3 C 05/19/23 1008  Pulse 81 05/19/23 1009  Resp 20 05/19/23 1009  SpO2 96 % 05/19/23 1009  Vitals shown include unfiled device data.  Last Pain:  Vitals:   05/19/23 1008  TempSrc: Temporal  PainSc: Asleep         Complications: No notable events documented.

## 2023-05-19 NOTE — Op Note (Signed)
 [facility] Gastroenterology Patient Name: Brenda Oneill Procedure Date: 05/19/2023 9:44 AM MRN: [id number] Account #: [id number] Date of Birth: [birth year] Admit Type: Outpatient Age: 68 Room: [facility] ENDO ROOM 2 Gender: Female Note Status: Finalized Instrument Name: Hyman Main [id number] Procedure:             Colonoscopy Indications:           High risk colon cancer surveillance: Personal history                         of non-advanced adenoma Providers:             Natsha Guidry K. Makeya Hilgert MD, MD Medicines:             Propofol  per Anesthesia Complications:         No immediate complications. Estimated blood loss: None. Procedure:             Pre-Anesthesia Assessment:                        - The risks and benefits of the procedure and the                         sedation options and risks were discussed with the                         patient. All questions were answered and informed                         consent was obtained.                        - Patient identification and proposed procedure were                         verified prior to the procedure by the nurse. The                         procedure was verified in the procedure room.                        - ASA Grade Assessment: III - A patient with severe                         systemic disease.                        - After reviewing the risks and benefits, the patient                         was deemed in satisfactory condition to undergo the                         procedure.                        After obtaining informed consent, the colonoscope was                         passed under direct vision. Throughout the procedure,  the patient's blood pressure, pulse, and oxygen                         saturations were monitored continuously. The                         Colonoscope was introduced through the anus and                         advanced to the the cecum, identified by  appendiceal                         orifice and ileocecal valve. The colonoscopy was                         performed without difficulty. The patient tolerated                         the procedure well. The quality of the bowel                         preparation was good. The ileocecal valve, appendiceal                         orifice, and rectum were photographed. Findings:      The perianal and digital rectal examinations were normal. Pertinent       negatives include normal sphincter tone and no palpable rectal lesions.      A few small-mouthed diverticula were found in the sigmoid colon.      Three small patchy angioectasias without bleeding were found in the       cecum. Estimated blood loss: none.      The exam was otherwise without abnormality on direct and retroflexion       views. Impression:            - Diverticulosis in the sigmoid colon.                        - Three non-bleeding colonic angioectasias.                        - The examination was otherwise normal on direct and                         retroflexion views.                        - No specimens collected. Recommendation:        - Patient has a contact number available for                         emergencies. The signs and symptoms of potential                         delayed complications were discussed with the patient.                         Return to normal activities tomorrow. Written  discharge instructions were provided to the patient.                        - Resume previous diet.                        - Continue present medications.                        - Repeat colonoscopy in 10 years for screening                         purposes.                        - Return to GI office PRN.                        - The findings and recommendations were discussed with                         the patient. Procedure Code(s):     --- Professional ---                        Z6109,  Colorectal cancer screening; colonoscopy on                         individual at high risk Diagnosis Code(s):     --- Professional ---                        K57.30, Diverticulosis of large intestine without                         perforation or abscess without bleeding                        K55.20, Angiodysplasia of colon without hemorrhage                        Z86.010, Personal history of colonic polyps CPT copyright 2022 [employer]. All rights reserved. The codes documented in this report are preliminary and upon coder review may  be revised to meet current compliance requirements. Cassie Click MD, MD 05/19/2023 10:07:46 AM This report has been signed electronically. Number of Addenda: 0 Note Initiated On: 05/19/2023 9:44 AM Scope Withdrawal Time: 0 hours 5 minutes 30 seconds  Total Procedure Duration: 0 hours 9 minutes 8 seconds  Estimated Blood Loss:  Estimated blood loss: none.      [facility]

## 2023-05-19 NOTE — Anesthesia Postprocedure Evaluation (Signed)
 Anesthesia Post Note  Patient: DELTA PICHON  Procedure(s) Performed: COLONOSCOPY WITH PROPOFOL   Patient location during evaluation: Endoscopy Anesthesia Type: General Level of consciousness: awake and alert Pain management: pain level controlled Vital Signs Assessment: post-procedure vital signs reviewed and stable Respiratory status: spontaneous breathing, nonlabored ventilation, respiratory function stable and patient connected to nasal cannula oxygen Cardiovascular status: blood pressure returned to baseline and stable Postop Assessment: no apparent nausea or vomiting Anesthetic complications: no   No notable events documented.   Last Vitals:  Vitals:   05/19/23 1008 05/19/23 1028  BP: (!) 102/56 124/71  Pulse:    Resp:    Temp:    SpO2:      Last Pain:  Vitals:   05/19/23 1028  TempSrc:   PainSc: 0-No pain                 Portia Brittle Brooklin Rieger

## 2023-05-19 NOTE — Interval H&P Note (Signed)
 History and Physical Interval Note:  05/19/2023 9:17 AM  Brenda Oneill  has presented today for surgery, with the diagnosis of Z86.0100 (ICD-10-CM) - History of colon polyps.  The various methods of treatment have been discussed with the patient and family. After consideration of risks, benefits and other options for treatment, the patient has consented to  Procedure(s): COLONOSCOPY WITH PROPOFOL  (N/A) as a surgical intervention.  The patient's history has been reviewed, patient examined, no change in status, stable for surgery.  I have reviewed the patient's chart and labs.  Questions were answered to the patient's satisfaction.     [place], Loyd Salvador

## 2023-05-19 NOTE — H&P (Signed)
 Outpatient short stay form Pre-procedure 05/19/2023 9:16 AM Brenda Oneill K. Corky Diener, M.D.  Primary Physician: Eddy Goodell III, M.D.  Reason for visit:  Personal history of adenomatous colon polyps  History of present illness:  11/10/2017 Physician: Dr. Glenis Oneill @ [facility] Polyps Removed: Adenomatous Polyps                            Patient presents for colonoscopy for a personal hx of colon polyps. The patient denies abdominal pain, abnormal weight loss or rectal bleeding.     Current Facility-Administered Medications:    0.9 %  sodium chloride  infusion, , Intravenous, Continuous, [place], Robinson Brinkley K, MD, Last Rate: 20 mL/hr at 05/19/23 0855, New Bag at 05/19/23 0855  Medications Prior to Admission  Medication Sig Dispense Refill Last Dose/Taking   aspirin  81 MG chewable tablet Chew 81 mg by mouth daily.   Past Week   cyanocobalamin (,VITAMIN B-12,) 1000 MCG/ML injection Inject 1,000 mcg into the muscle every 30 (thirty) days.   Past Week   Multiple Vitamin (MULTIVITAMIN) tablet Take 1 tablet by mouth daily.   Past Week   thyroid  (ARMOUR THYROID ) 30 MG tablet Take by mouth.   05/18/2023   vitamin A 10000 UNIT capsule Take 10,000 Units by mouth daily.   Past Week   Vitamin D , Ergocalciferol , (DRISDOL ) 50000 units CAPS capsule Take 50,000 Units by mouth once a week.   Past Week   albuterol (VENTOLIN HFA) 108 (90 Base) MCG/ACT inhaler Inhale into the lungs.      ALLEGRA ALLERGY 60 MG tablet       ALPRAZolam  (XANAX ) 0.25 MG tablet Take 0.25 mg by mouth daily.       betamethasone valerate (VALISONE) 0.1 % cream Apply topically 2 (two) times daily as needed.       clobetasol  ointment (TEMOVATE ) 0.05 % Apply to affected area every day for 4 weeks, then every other day for 4 weeks and then twice a week for 4 weeks or until resolution. 30 g 0    fluticasone  (FLONASE ) 50 MCG/ACT nasal spray 1-2 sprays in each nostril daily (Patient taking differently: as needed. 1-2 sprays in each nostril daily) 16 g 0     mometasone  (ELOCON ) 0.1 % cream Apply to affected area rash on neck twice daily until improved. Avoid face, groin, axilla. 45 g 1    tacrolimus  (PROTOPIC ) 0.1 % ointment Apply to affected areas rash face/body once to twice a day. 60 g 1      Allergies  Allergen Reactions   Doxycycline Nausea And Vomiting, Nausea Only and Other (See Comments)   Nitrofurantoin Other (See Comments)   Nitrofurantoin Macrocrystal Other (See Comments)     Past Medical History:  Diagnosis Date   Anxiety    Aortic atherosclerosis (HCC)    Atypical mole 12/02/2015   right upper earlobe/excision, intradermal melanocytic proliferation   Atypical mole 07/20/2017   right upper arm/moderate   Atypical mole 08/12/2017   right post thigh inferior buttock/excision   B12 deficiency    Complication of anesthesia    slow to wake up with one surgery years ago   Depression    Hashimoto's thyroiditis    Hyperlipemia    Hypothyroidism    Near syncope    a. 08/2009 Holter - PAC's, PVC's.   OSA (obstructive sleep apnea)    a. uses CPAP.   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 Daily Acyclovir    Retinitis pigmentosa    Retinitis pigmentosa    Shingles    Vitamin D  deficiency    Vulvitis     Review of systems:  Otherwise negative.    Physical Exam  Gen: Alert, oriented. Appears stated age.  HEENT: [place]/AT. PERRLA. Lungs: CTA, no wheezes. CV: RR nl S1, S2. Abd: soft, benign, no masses. BS+ Ext: No edema. Pulses 2+    Planned procedures: Proceed with colonoscopy. The patient understands the nature of the planned procedure, indications, risks, alternatives and potential complications including but not limited to bleeding, infection, perforation, damage to internal organs and possible oversedation/side effects from anesthesia. The patient agrees and gives consent to proceed.  Please refer to  procedure notes for findings, recommendations and patient disposition/instructions.     Sumeet Geter K. Corky Diener, M.D. Gastroenterology 05/19/2023  9:16 AM

## 2023-05-19 NOTE — Anesthesia Preprocedure Evaluation (Signed)
 Anesthesia Evaluation  Patient identified by MRN, date of birth, ID band Patient awake    Reviewed: Allergy & Precautions, NPO status , Patient's Chart, lab work & pertinent test results  History of Anesthesia Complications (+) PROLONGED EMERGENCE and history of anesthetic complications  Airway Mallampati: III  TM Distance: <3 FB Neck ROM: full    Dental  (+) Chipped   Pulmonary neg shortness of breath, sleep apnea , former smoker   Pulmonary exam normal        Cardiovascular Exercise Tolerance: Good (-) angina negative cardio ROS Normal cardiovascular exam     Neuro/Psych  PSYCHIATRIC DISORDERS      negative neurological ROS     GI/Hepatic negative GI ROS, Neg liver ROS,neg GERD  ,,  Endo/Other  negative endocrine ROS    Renal/GU negative Renal ROS  negative genitourinary   Musculoskeletal   Abdominal   Peds  Hematology negative hematology ROS (+)   Anesthesia Other Findings Past Medical History: No date: Anxiety No date: Aortic atherosclerosis (HCC) 12/02/2015: Atypical mole     Comment:  right upper earlobe/excision, intradermal melanocytic               proliferation 07/20/2017: Atypical mole     Comment:  right upper arm/moderate 08/12/2017: Atypical mole     Comment:  right post thigh inferior buttock/excision No date: B12 deficiency No date: Complication of anesthesia     Comment:  slow to wake up with one surgery years ago No date: Depression No date: Hashimoto's thyroiditis No date: Hyperlipemia No date: Hypothyroidism No date: Near syncope     Comment:  a. 08/2009 Holter - PAC's, PVC's. No date: OSA (obstructive sleep apnea)     Comment:  a. uses CPAP. 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 Daily Acyclovir  No date: Retinitis pigmentosa No  date: Retinitis pigmentosa No date: Shingles No date: Vitamin D  deficiency No date: Vulvitis  Past Surgical History: 2014: BREAST CYST ASPIRATION; Right No date: CATARACT EXTRACTION No date: CESAREAN SECTION No date: COLONOSCOPY     Comment:  a. 10/2007 - Internal hemorrohoids noted, otw nl. 11/10/2017: COLONOSCOPY WITH PROPOFOL ; N/A     Comment:  Procedure: COLONOSCOPY WITH PROPOFOL ;  Surgeon: Cassie Click, MD;  Location: [facility] ENDOSCOPY;  Service:               Endoscopy;  Laterality: N/A; No date: CYST REMOVAL NECK 03/13/2016: LEFT HEART CATH AND CORONARY ANGIOGRAPHY; N/A     Comment:  Procedure: Left Heart Cath and Coronary Angiography;                Surgeon: Wenona Hamilton, MD;  Location: [facility] INVASIVE               CV LAB;  Service: Cardiovascular;  Laterality: N/A; No date: TONSILLECTOMY No date: TUBAL LIGATION No date: UPPER GI ENDOSCOPY     Comment:  a. 12/2011 Benign polyps.  BMI    Body Mass Index: 29.80 kg/m      Reproductive/Obstetrics negative OB ROS                             Anesthesia Physical Anesthesia Plan  ASA: 2  Anesthesia Plan: General  Post-op Pain Management:    Induction: Intravenous  PONV Risk Score and Plan: Propofol  infusion and TIVA  Airway Management Planned: Natural Airway and Nasal Cannula  Additional Equipment:   Intra-op Plan:   Post-operative Plan:   Informed Consent: I have reviewed the patients History and Physical, chart, labs and discussed the procedure including the risks, benefits and alternatives for the proposed anesthesia with the patient or authorized representative who has indicated his/her understanding and acceptance.     Dental Advisory Given  Plan Discussed with: Anesthesiologist, CRNA and Surgeon  Anesthesia Plan Comments: (Patient consented for risks of anesthesia including but not limited to:  - adverse reactions to medications - risk of airway placement  if required - damage to eyes, teeth, lips or other oral mucosa - nerve damage due to positioning  - sore throat or hoarseness - Damage to heart, brain, nerves, lungs, other parts of body or loss of life  Patient voiced understanding and assent.)       Anesthesia Quick Evaluation

## 2023-05-20 ENCOUNTER — Encounter: Payer: Self-pay | Admitting: Internal Medicine

## 2023-06-07 ENCOUNTER — Encounter (HOSPITAL_COMMUNITY): Payer: Self-pay

## 2023-06-14 ENCOUNTER — Ambulatory Visit
Admission: RE | Admit: 2023-06-14 | Discharge: 2023-06-14 | Disposition: A | Discharge status: Home / Self Care | Source: Ambulatory Visit | Attending: Obstetrics and Gynecology | Admitting: Obstetrics and Gynecology

## 2023-06-14 DIAGNOSIS — Z1231 Encounter for screening mammogram for malignant neoplasm of breast: Secondary | ICD-10-CM | POA: Diagnosis present

## 2023-10-29 NOTE — Therapy (Signed)
 OUTPATIENT OCCUPATIONAL THERAPY ORTHO EVALUATION  Patient Name: Brenda Oneill MRN: [id number] DOB:06/04/1955, 68 y.o., female Today's Date: 11/01/2023  PCP: Dr Fernande MART PROVIDER: Kip PA  END OF SESSION:  OT End of Session - 11/01/23 1728     Visit Number 1    Number of Visits 3    Date for Recertification  12/13/23    OT Start Time 1700    OT Stop Time 1724    OT Time Calculation (min) 24 min    Activity Tolerance Patient tolerated treatment well    Behavior During Therapy [facility] for tasks assessed/performed          Past Medical History:  Diagnosis Date   Anxiety    Aortic atherosclerosis    Atypical mole 12/02/2015   right upper earlobe/excision, intradermal melanocytic proliferation   Atypical mole 07/20/2017   right upper arm/moderate   Atypical mole 08/12/2017   right post thigh inferior buttock/excision   B12 deficiency    Complication of anesthesia    slow to wake up with one surgery years ago   Depression    Hashimoto's thyroiditis    Hyperlipemia    Hypothyroidism    Near syncope    a. 08/2009 Holter - PAC's, PVC's.   OSA (obstructive sleep apnea)    a. uses CPAP.   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 Daily Acyclovir    Retinitis pigmentosa    Retinitis pigmentosa    Shingles    Vitamin D  deficiency    Vulvitis    Past Surgical History:  Procedure Laterality Date   BREAST CYST ASPIRATION Right 2014   CATARACT EXTRACTION     CESAREAN SECTION     COLONOSCOPY     a. 10/2007 - Internal hemorrohoids noted, otw nl.   COLONOSCOPY WITH PROPOFOL  N/A 11/10/2017   Procedure: COLONOSCOPY WITH PROPOFOL ;  Surgeon: Viktoria Lamar DASEN, MD;  Location: [facility] ENDOSCOPY;  Service: Endoscopy;  Laterality: N/A;   COLONOSCOPY WITH PROPOFOL  N/A 05/19/2023   Procedure: COLONOSCOPY WITH PROPOFOL ;  Surgeon: Toledo, Ladell POUR, MD;  Location: [facility]  ENDOSCOPY;  Service: Gastroenterology;  Laterality: N/A;   CYST REMOVAL NECK     LEFT HEART CATH AND CORONARY ANGIOGRAPHY N/A 03/13/2016   Procedure: Left Heart Cath and Coronary Angiography;  Surgeon: Deatrice DELENA Cage, MD;  Location: [facility] INVASIVE CV LAB;  Service: Cardiovascular;  Laterality: N/A;   TONSILLECTOMY     TUBAL LIGATION     UPPER GI ENDOSCOPY     a. 12/2011 Benign polyps.   Patient Active Problem List   Diagnosis Date Noted   Nausea without vomiting 03/14/2016   Pneumonitis 03/14/2016   Leukocytosis 03/14/2016   Elevated troponin 03/14/2016   hyperglycemia 03/14/2016   Hyperglycemia 03/14/2016   Chest pain 03/13/2016   HLD (hyperlipidemia) 05/27/2015   Adult hypothyroidism 05/27/2015   Osteopenia 05/27/2015   Retinitis pigmentosa 05/27/2015   Herpes zona 05/27/2015   Apnea, sleep 05/27/2015   Multinodular goiter 12/04/2013   Addison anemia 04/24/2013   Avitaminosis D 04/24/2013    ONSET DATE: 09/07/23  REFERRING DIAG: Non displaced fx of L 4th digit middle phalanges  THERAPY DIAG:  Pain in left finger(s)  Rationale for Evaluation and Treatment: Rehabilitation  SUBJECTIVE:   SUBJECTIVE STATEMENT: I have this red spot still on the side of my joint on my ring finger.  My  joint still swollen.  But no pain no stiffness.  I use it normally. Pt accompanied by: self  PERTINENT HISTORY:  Injury was 09/07/23  Ortho notes: 9/2/25The patient presents today without any protective devices to the left ring finger. Examination of the left hand does reveal continued swelling involving the PIP joint of the left ring finger however is improved compared to her most recent evaluation. There is a small area of erythema along the ulnar aspect of the PIP joint however appears more irritated versus infectious. Tenderness to palpation along the dorsal and volar aspect of the ring finger PIP joint is improved but still present. She does still have some discomfort palpation along the  ulnar and radial aspect of the PIP joint. No rotational deformities identified. She is able to fully extend her left ring finger. She is able to flex at both the PIP and DIP joints, at most a few degrees lacking, flexion has improved compared to her previous visit. Intact flexion when isolating the FDS and FDP tendons. She has a negative Elson test involving the ring finger at today's appointment. She is intact light touch throughout left upper extremity. Cap refills intact to each individual digit. Radial and ulnar pulse are intact to the left wrist.   Imaging: AP, lateral and oblique images of the left ring finger were obtained today in the office and viewed by me. These x-rays demonstrate what appears to be a very small essentially nondisplaced volar avulsion injury at the base of the middle phalanx. No shifting when compared to previous x-rays. No other acute abnormalities identified. There appears to be moderate callus formation which is formed at this time. No acute abnormalities when compared to her previous x-rays.  Impression: Closed nondisplaced fracture of middle phalanx of left ring finger with routine healing, subsequent encounter [S62.655D] Closed nondisplaced fracture of middle phalanx of left ring finger with routine healing, subsequent encounter (primary encounter diagnosis) Finger pain, left  Plan:  1. Treatment options were discussed today with the patient. 2. The patient contact the office for repeat evaluation due to increased pain and swelling with attempted increase motion and activities. 3. The redness along the ulnar aspect of the finger appears to be more irritated likely from her prolonged peers of buddy taping the fingers and irritation between the fingers. A fishnet stocking was applied over the finger and she was encouraged to wear this to decrease irritation to the skin. 4. There does not appear to be any evidence of a central slip injury, negative Elson test. I believe  her continued discomfort is likely still stemming from the irritation from the small avulsion injury. 5. She was encouraged to continue to work on range of motion exercises. A hand therapy referral was placed for the patient today. 5. The patient will follow-up at her next scheduled visit for repeat evaluation of the finger. 6. They can call the clinic they have any questions, new symptoms develop or symptoms worsen   PRECAUTIONS: None      WEIGHT BEARING RESTRICTIONS: No  PAIN:  Are you having pain? No-some tenderness on the radial side of fourth PIP  FALLS: Has patient fallen in last 6 months?  No  LIVING ENVIRONMENT: Lives with: lives with their spouse  PLOF: Patient independent in house making task.  She is retired.  Takes care of her mom.  With helping her to get in and out of the bath and dressing as well as house work.  Likes to do crafts-like wreath making  PATIENT GOALS: I just want the swelling of my joint better  NEXT MD VISIT: 11/24/23  OBJECTIVE:  Note: Objective measures were completed at Evaluation unless otherwise noted.  HAND DOMINANCE: Right  ADLs: No issues  FUNCTIONAL OUTCOME MEASURES: PRWHE pain 2/50; function 0/50  UPPER EXTREMITY ROM:     Active ROM Right eval Left eval  Shoulder flexion    Shoulder abduction    Shoulder adduction    Shoulder extension    Shoulder internal rotation    Shoulder external rotation    Elbow flexion    Elbow extension    Wrist flexion    Wrist extension    Wrist ulnar deviation    Wrist radial deviation    Wrist pronation    Wrist supination    (Blank rows = not tested)  Active ROM Right eval Left eval  Thumb MCP (0-60)    Thumb IP (0-80)    Thumb Radial abd/add (0-55)     Thumb Palmar abd/add (0-45)     Thumb Opposition to Small Finger     Index MCP (0-90)     Index PIP (0-100)     Index DIP (0-70)      Long MCP (0-90)      Long PIP (0-100)      Long DIP (0-70)      Ring MCP (0-90)      Ring  PIP (0-100)      Ring DIP (0-70)      Little MCP (0-90)      Little PIP (0-100)      Little DIP (0-70)      (Blank rows = not tested) Active range of motion for digit flexion and extension within normal limits HAND FUNCTION: Grip strength: Right: 55 lbs; Left: 55 lbs, Lateral pinch: Right: 15 lbs, Left: 15 lbs, and 3 point pinch: Right: 16 lbs, Left: 17 lbs  COORDINATION: Within normal range  SENSATION: No issues reported  EDEMA: Joint minimal and large with a red area on radial side of PIP of the fourth digit  COGNITION: Overall cognitive status: Within functional limits for tasks assessed      TREATMENT DATE: 11/01/23                                                                                                                            Patient has normal range of motion and strength.  Joint minimally enlarged on the left fourth PIP Patient fitted with large/extra-large and medium/small silicone digital sleeve to wear at nighttime Patient provided with 3 to use at night to decrease swelling at the fourth PIP Patient in the morning can do some moist heat prior to tendon glides in case stiffness occurred in the morning Recommend to follow-up in 2 to 3 weeks.     PATIENT EDUCATION: Education details: findings of eval and HEP  Person educated: Patient Education method: Explanation, Demonstration, Tactile cues, Verbal cues, and Handouts Education comprehension: verbalized understanding,  returned demonstration, verbal cues required, and needs further education     GOALS: Goals reviewed with patient? Yes  Goal status: INITIAL  LONG TERM GOALS: Target date: 6 wks  Patient to be independent in home program to wear silicone compression sleeve at nighttime to decrease edema/redness and tenderness on radial PIP of left fourth Baseline: Patient was fitted with silicone sleeve to wear at nighttime for 2 to 3 weeks; redness on the radial PIP; minimal edema no pain but reports  some tenderness at times Goal status: INITIAL   ASSESSMENT:  CLINICAL IMPRESSION: Patient seen today for occupational therapy evaluation for left fourth digit middle phalanges nondisplaced fracture.  Patient presented OT evaluation with active range of motion within normal range for flexion and extension with no pain.  Grip and prehension strength within normal range pain-free.  Patient has minimal edema at the left fourth PIP.  No pain but patient reports some tenderness at times with redness on the radial side of the PIP.  Patient was fitted with silicone compression sleeves to wear at nighttime to decrease edema at PIP, decrease tenderness and redness.  Patient can wear it for 2 to 3 weeks at nighttime -and if stiffness occurs in the morning she can do some moist heat with tendon glides.  Patient to call and cancel in 2 to 3 weeks if no need for follow-up.  Patient can benefit from skilled OT services to decrease edema, tenderness and redness at left PIP to return to prior level of function.   PERFORMANCE DEFICITS: in functional skills including ADLs, IADLs, ROM, strength, pain, flexibility, decreased knowledge of use of DME, and UE functional use,   and psychosocial skills including environmental adaptation and routines and behaviors.   IMPAIRMENTS: are limiting patient from ADLs, IADLs, rest and sleep, play, leisure, and social participation.   COMORBIDITIES: has no other co-morbidities that affects occupational performance. Patient will benefit from skilled OT to address above impairments and improve overall function.  MODIFICATION OR ASSISTANCE TO COMPLETE EVALUATION: No modification of tasks or assist necessary to complete an evaluation.  OT OCCUPATIONAL PROFILE AND HISTORY: Problem focused assessment: Including review of records relating to presenting problem.  CLINICAL DECISION MAKING: LOW - limited treatment options, no task modification necessary  REHAB POTENTIAL: Good for  goals  EVALUATION COMPLEXITY: Low      PLAN:  OT FREQUENCY: 2 visits  OT DURATION: 6 weeks  PLANNED INTERVENTIONS: 97168 OT Re-evaluation, 97535 self care/ADL training, 02889 therapeutic exercise, 97530 therapeutic activity, 97112 neuromuscular re-education, 97140 manual therapy, 97018 paraffin, 02960 fluidotherapy, 97034 contrast bath, 97760 Orthotic Initial, [id number] Orthotic/Prosthetic subsequent, passive range of motion, patient/family education, and DME and/or AE instructions    CONSULTED AND AGREED WITH PLAN OF CARE: Patient     Ancel Peters, OTR/L,CLT 11/01/2023, 5:29 PM

## 2023-11-01 ENCOUNTER — Encounter: Payer: Self-pay | Admitting: Occupational Therapy

## 2023-11-01 ENCOUNTER — Ambulatory Visit: Attending: Student | Admitting: Occupational Therapy

## 2023-11-01 DIAGNOSIS — M79645 Pain in left finger(s): Secondary | ICD-10-CM | POA: Insufficient documentation

## 2023-11-02 ENCOUNTER — Ambulatory Visit: Payer: Medicare HMO | Admitting: Dermatology

## 2023-11-02 DIAGNOSIS — L989 Disorder of the skin and subcutaneous tissue, unspecified: Secondary | ICD-10-CM | POA: Diagnosis not present

## 2023-11-02 DIAGNOSIS — W908XXA Exposure to other nonionizing radiation, initial encounter: Secondary | ICD-10-CM

## 2023-11-02 DIAGNOSIS — L814 Other melanin hyperpigmentation: Secondary | ICD-10-CM

## 2023-11-02 DIAGNOSIS — L578 Other skin changes due to chronic exposure to nonionizing radiation: Secondary | ICD-10-CM | POA: Diagnosis not present

## 2023-11-02 DIAGNOSIS — L821 Other seborrheic keratosis: Secondary | ICD-10-CM

## 2023-11-02 DIAGNOSIS — D239 Other benign neoplasm of skin, unspecified: Secondary | ICD-10-CM

## 2023-11-02 DIAGNOSIS — L309 Dermatitis, unspecified: Secondary | ICD-10-CM

## 2023-11-02 DIAGNOSIS — Z1283 Encounter for screening for malignant neoplasm of skin: Secondary | ICD-10-CM

## 2023-11-02 DIAGNOSIS — Z86018 Personal history of other benign neoplasm: Secondary | ICD-10-CM

## 2023-11-02 DIAGNOSIS — D1801 Hemangioma of skin and subcutaneous tissue: Secondary | ICD-10-CM

## 2023-11-02 DIAGNOSIS — L820 Inflamed seborrheic keratosis: Secondary | ICD-10-CM

## 2023-11-02 DIAGNOSIS — D229 Melanocytic nevi, unspecified: Secondary | ICD-10-CM

## 2023-11-02 DIAGNOSIS — D2371 Other benign neoplasm of skin of right lower limb, including hip: Secondary | ICD-10-CM

## 2023-11-02 DIAGNOSIS — L209 Atopic dermatitis, unspecified: Secondary | ICD-10-CM

## 2023-11-02 MED ORDER — MOMETASONE FUROATE 0.1 % EX CREA: TOPICAL_CREAM | CUTANEOUS | 1 refills | Status: AC

## 2023-11-02 MED ORDER — TACROLIMUS 0.1 % EX OINT
TOPICAL_OINTMENT | CUTANEOUS | 1 refills | Status: AC
Start: 2023-11-02 — End: ?

## 2023-11-02 NOTE — Progress Notes (Signed)
 Follow-Up Visit   Subjective  Brenda Oneill is a 68 y.o. female who presents for the following: Skin Cancer Screening and Full Body Skin Exam. History of Dysplastic Nevi.   The patient presents for Total-Body Skin Exam (TBSE) for skin cancer screening and mole check. The patient has spots, moles and lesions to be evaluated, some may be new or changing. She has an itchy rash on the left jaw that came up a month ago, also R leg. She used tacrolimus  ointment yesterday. Also an irritated spot on the right upper arm x few weeks.    The following portions of the chart were reviewed this encounter and updated as appropriate: medications, allergies, medical history  Review of Systems:  No other skin or systemic complaints except as noted in HPI or Assessment and Plan.  Objective  Well appearing patient in no apparent distress; mood and affect are within normal limits.  A full examination was performed including scalp, head, eyes, ears, nose, lips, neck, chest, axillae, abdomen, back, buttocks, bilateral upper extremities, bilateral lower extremities, hands, feet, fingers, toes, fingernails, and toenails. All findings within normal limits unless otherwise noted below.   Relevant physical exam findings are noted in the Assessment and Plan.  R upper arm x 1 Erythematous stuck-on, waxy papule  Assessment & Plan   SKIN CANCER SCREENING PERFORMED TODAY.  ACTINIC DAMAGE - Chronic condition, secondary to cumulative UV/sun exposure - diffuse scaly erythematous macules with underlying dyspigmentation - Recommend daily broad spectrum sunscreen SPF 30+ to sun-exposed areas, reapply every 2 hours as needed.  - Staying in the shade or wearing long sleeves, sun glasses (UVA+UVB protection) and wide brim hats (4-inch brim around the entire circumference of the hat) are also recommended for sun protection.  - Call for new or changing lesions.  LENTIGINES, SEBORRHEIC KERATOSES, HEMANGIOMAS - Benign  normal skin lesions - Benign-appearing - Call for any changes  MELANOCYTIC NEVI - Tan-brown and/or pink-flesh-colored symmetric macules and papules - Benign appearing on exam today - Observation - Call clinic for new or changing moles - Recommend daily use of broad spectrum spf 30+ sunscreen to sun-exposed areas.   Atopic Dermatitis Exam: Pink scaly patch at the right chin; pink scaly patch with excoriation on the right lower leg  Chronic and persistent condition with duration or expected duration over one year. Condition is improving with treatment but not currently at goal.   Atopic dermatitis (eczema) is a chronic, relapsing, pruritic condition that can significantly affect quality of life. It is often associated with allergic rhinitis and/or asthma and can require treatment with topical medications, phototherapy, or in severe cases biologic injectable medication (Dupixent, Adbry, Ebglyss) or oral JAK inhibitors (Rinvoq, Cibinqo).    Treatment Plan: Continue tacrolimus  0.1% ointment once to twice daily as needed for itchy rash.  Continue Mometasone  cream every day/bid prn more severe flares, avoid f/g/a  Topical steroids (such as triamcinolone, fluocinolone, fluocinonide, mometasone , clobetasol , halobetasol, betamethasone, hydrocortisone) can cause thinning and lightening of the skin if they are used for too long in the same area. Your physician has selected the right strength medicine for your problem and area affected on the body. Please use your medication only as directed by your physician to prevent side effects.    NEVUS VS LENTIGO Exam: 3.0 mm tan macule at the left chest  Benign-appearing.  Observation.  Call clinic for new or changing moles.  Recommend daily use of broad spectrum spf 30+ sunscreen to sun-exposed areas.   DERMATOFIBROMA  Exam: Firm pink/brown papulenodule with dimple sign at right medial thigh.  Treatment Plan: A dermatofibroma is a benign growth possibly  related to trauma, such as an insect bite, cut from shaving, or inflamed acne-type bump.  Treatment options to remove include shave or excision with resulting scar and risk of recurrence.  Since benign-appearing and not bothersome, will observe for now.   History of Dysplastic Nevi - No evidence of recurrence today - Recommend regular full body skin exams - Recommend daily broad spectrum sunscreen SPF 30+ to sun-exposed areas, reapply every 2 hours as needed.  - Call if any new or changing lesions are noted between office visits  INFLAMED SEBORRHEIC KERATOSIS R upper arm x 1 Symptomatic, irritating, patient would like treated. Destruction of lesion - R upper arm x 1  Destruction method: cryotherapy   Informed consent: discussed and consent obtained   Lesion destroyed using liquid nitrogen: Yes   Region frozen until ice ball extended beyond lesion: Yes   Outcome: patient tolerated procedure well with no complications   Post-procedure details: wound care instructions given   Additional details:  Prior to procedure, discussed risks of blister formation, small wound, skin dyspigmentation, or rare scar following cryotherapy. Recommend Vaseline ointment to treated areas while healing.   DERMATITIS   Related Medications tacrolimus  (PROTOPIC ) 0.1 % ointment Apply to affected areas rash face/body once to twice a day. mometasone  (ELOCON ) 0.1 % cream Apply to affected area rash on body twice daily until improved. Avoid face, groin, axilla. Return in about 1 year (around 11/01/2024) for TBSE, Hx Dysplastic Nevus.  IAndrea Kerns, CMA, am acting as scribe for Rexene Rattler, MD .   Documentation: I have reviewed the above documentation for accuracy and completeness, and I agree with the above.  Rexene Rattler, MD

## 2023-11-02 NOTE — Patient Instructions (Addendum)
 Cryotherapy Aftercare  Wash gently with soap and water everyday.   Apply Vaseline and Band-Aid daily until healed.     Gentle Skin Care Guide  1. Bathe no more than once a day.  2. Avoid bathing in hot water  3. Use a mild soap like Dove, Vanicream, Cetaphil, CeraVe. Can use Lever 2000 or Cetaphil antibacterial soap  4. Use soap only where you need it. On most days, use it under your arms, between your legs, and on your feet. Let the water rinse other areas unless visibly dirty.  5. When you get out of the bath/shower, use a towel to gently blot your skin dry, don't rub it.  6. While your skin is still a little damp, apply a moisturizing cream such as Vanicream, CeraVe, Cetaphil, Eucerin, Sarna lotion or plain Vaseline Jelly. For hands apply Neutrogena [place] Hand Cream or Excipial Hand Cream.  7. Reapply moisturizer any time you start to itch or feel dry.  8. Sometimes using free and clear laundry detergents can be helpful. Fabric softener sheets should be avoided. Downy Free & Gentle liquid, or any liquid fabric softener that is free of dyes and perfumes, it acceptable to use  9. If your doctor has given you prescription creams you may apply moisturizers over them    Seborrheic Keratosis  What causes seborrheic keratoses? Seborrheic keratoses are harmless, common skin growths that first appear during adult life.  As time goes by, more growths appear.  Some people may develop a large number of them.  Seborrheic keratoses appear on both covered and uncovered body parts.  They are not caused by sunlight.  The tendency to develop seborrheic keratoses can be inherited.  They vary in color from skin-colored to gray, brown, or even black.  They can be either smooth or have a rough, warty surface.   Seborrheic keratoses are superficial and look as if they were stuck on the skin.  Under the microscope this type of keratosis looks like layers upon layers of skin.  That is why at times the  top layer may seem to fall off, but the rest of the growth remains and re-grows.    Treatment Seborrheic keratoses do not need to be treated, but can easily be removed in the office.  Seborrheic keratoses often cause symptoms when they rub on clothing or jewelry.  Lesions can be in the way of shaving.  If they become inflamed, they can cause itching, soreness, or burning.  Removal of a seborrheic keratosis can be accomplished by freezing, burning, or surgery. If any spot bleeds, scabs, or grows rapidly, please return to have it checked, as these can be an indication of a skin cancer.   Due to recent changes in healthcare laws, you may see results of your pathology and/or laboratory studies on MyChart before the doctors have had a chance to review them. We understand that in some cases there may be results that are confusing or concerning to you. Please understand that not all results are received at the same time and often the doctors may need to interpret multiple results in order to provide you with the best plan of care or course of treatment. Therefore, we ask that you please give us  2 business days to thoroughly review all your results before contacting the office for clarification. Should we see a critical lab result, you will be contacted sooner.   If You Need Anything After Your Visit  If you have any questions or concerns for  your doctor, please call our main line at [phone] and press option 4 to reach your doctor's medical assistant. If no one answers, please leave a voicemail as directed and we will return your call as soon as possible. Messages left after 4 pm will be answered the following business day.   You may also send us  a message via MyChart. We typically respond to MyChart messages within 1-2 business days.  For prescription refills, please ask your pharmacy to contact our office. Our fax number is [phone].  If you have an urgent issue when the clinic is closed that  cannot wait until the next business day, you can page your doctor at the number below.    Please note that while we do our best to be available for urgent issues outside of office hours, we are not available 24/7.   If you have an urgent issue and are unable to reach us , you may choose to seek medical care at your doctor's office, retail clinic, urgent care center, or emergency room.  If you have a medical emergency, please immediately call 911 or go to the emergency department.  Pager Numbers  - Dr. Hester: [phone]  - Dr. Jackquline: [phone]  - Dr. Claudene: [phone]   - Dr. Raymund: [phone]  In the event of inclement weather, please call our main line at [phone] for an update on the status of any delays or closures.  Dermatology Medication Tips: Please keep the boxes that topical medications come in in order to help keep track of the instructions about where and how to use these. Pharmacies typically print the medication instructions only on the boxes and not directly on the medication tubes.   If your medication is too expensive, please contact our office at [phone] option 4 or send us  a message through MyChart.   We are unable to tell what your co-pay for medications will be in advance as this is different depending on your insurance coverage. However, we may be able to find a substitute medication at lower cost or fill out paperwork to get insurance to cover a needed medication.   If a prior authorization is required to get your medication covered by your insurance company, please allow us  1-2 business days to complete this process.  Drug prices often vary depending on where the prescription is filled and some pharmacies may offer cheaper prices.  The website www.goodrx.com contains coupons for medications through different pharmacies. The prices here do not account for what the cost may be with help from insurance (it may be cheaper with your insurance),  but the website can give you the price if you did not use any insurance.  - You can print the associated coupon and take it with your prescription to the pharmacy.  - You may also stop by our office during regular business hours and pick up a GoodRx coupon card.  - If you need your prescription sent electronically to a different pharmacy, notify our office through [facility] or by phone at [phone] option 4.     Si Usted Necesita Algo Despus de Su Visita  Tambin puede enviarnos un mensaje a travs de Clinical cytogeneticist. Por lo general respondemos a los mensajes de MyChart en el transcurso de 1 a 2 das hbiles.  Para renovar recetas, por favor pida a su farmacia que se ponga en contacto con nuestra oficina. Randi lakes de fax es [phone].  Si tiene un asunto urgente cuando la clnica est cerrada y  que no puede esperar hasta el siguiente da hbil, puede llamar/localizar a su doctor(a) al nmero que aparece a continuacin.   Por favor, tenga en cuenta que aunque hacemos todo lo posible para estar disponibles para asuntos urgentes fuera del horario de [place], no estamos disponibles las 24 horas del da, los 7 [address]  [address] de la [place].   Si tiene un problema urgente y no puede comunicarse con nosotros, puede optar por buscar atencin mdica  en el consultorio de su doctor(a), en una clnica privada, en un centro de atencin urgente o en una sala de emergencias.  Si tiene Engineer, drilling, por favor llame inmediatamente al 911 o vaya a la sala de emergencias.  Nmeros de bper  - Dr. Hester: [phone]  - Dra. Jackquline: [phone]  - Dr. Claudene: [phone]  - Dra. Kitts: [phone]  En caso de inclemencias del [place], por favor llame a nuestra lnea principal al [phone] para una actualizacin sobre el estado de cualquier retraso o cierre.  Consejos para la medicacin en dermatologa: Por favor, guarde las cajas en las que vienen los medicamentos de uso tpico  para ayudarle a seguir las instrucciones sobre dnde y cmo usarlos. Las farmacias generalmente imprimen las instrucciones del medicamento slo en las cajas y no directamente en los tubos del [place].   Si su medicamento es muy caro, por favor, pngase en contacto con landry rieger llamando al [phone] y presione la opcin 4 o envenos un mensaje a travs de Clinical cytogeneticist.   No podemos decirle cul ser su copago por los medicamentos por adelantado ya que esto es diferente dependiendo de la cobertura de su seguro. Sin embargo, es posible que podamos encontrar un medicamento sustituto a Audiological scientist un formulario para que el seguro cubra el medicamento que se considera necesario.   Si se requiere una autorizacin previa para que su compaa de seguros malta su medicamento, por favor permtanos de 1 a 2 das hbiles para completar este proceso.  Los precios de los medicamentos varan con frecuencia dependiendo del Environmental consultant de dnde se surte la receta y alguna farmacias pueden ofrecer precios ms baratos.  El sitio web www.goodrx.com tiene cupones para medicamentos de Health and safety inspector. Los precios aqu no tienen en cuenta lo que podra costar con la ayuda del seguro (puede ser ms barato con su seguro), pero el sitio web puede darle el precio si no utiliz Tourist information centre manager.  - Puede imprimir el cupn correspondiente y llevarlo con su receta a la farmacia.  - Tambin puede pasar por nuestra oficina durante el horario de atencin regular y Education officer, museum una tarjeta de cupones de GoodRx.  - Si necesita que su receta se enve electrnicamente a una farmacia diferente, informe a nuestra oficina a travs de MyChart de [place] o por telfono llamando al [phone] y presione la opcin 4.

## 2023-11-05 ENCOUNTER — Ambulatory Visit: Admitting: Occupational Therapy

## 2023-11-11 ENCOUNTER — Encounter: Admitting: Occupational Therapy

## 2023-11-22 ENCOUNTER — Encounter: Admitting: Occupational Therapy

## 2023-12-02 IMAGING — US US BREAST*R* LIMITED INC AXILLA
1 series · 5 of 5 positions shown · non-contrast
Comparison: Previous exam(s).

CLINICAL DATA: 66-year-old presenting with focal pain in the outer
RIGHT breast which began approximately 6 months ago at the time of a
COVID diagnosis. Annual evaluation, LEFT breast.

EXAM:
DIGITAL DIAGNOSTIC BILATERAL MAMMOGRAM WITH TOMOSYNTHESIS AND CAD;
ULTRASOUND RIGHT BREAST LIMITED
TECHNIQUE: Bilateral digital diagnostic mammography and breast tomosynthesis
was performed. The images were evaluated with computer-aided
detection.; Targeted ultrasound examination of the right breast was
performed.

[Series 1: us breast*right* limited inc axilla · 0.07mm/px · 5 of 5 slices shown]
[im 1/5]
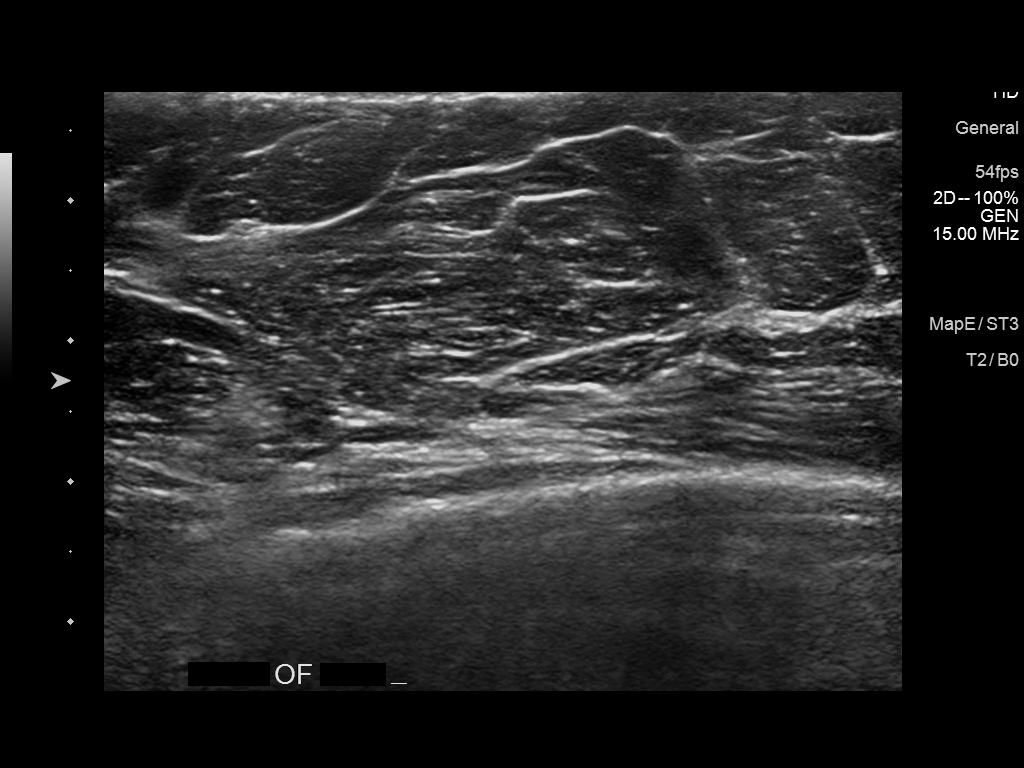
[im 2/5]
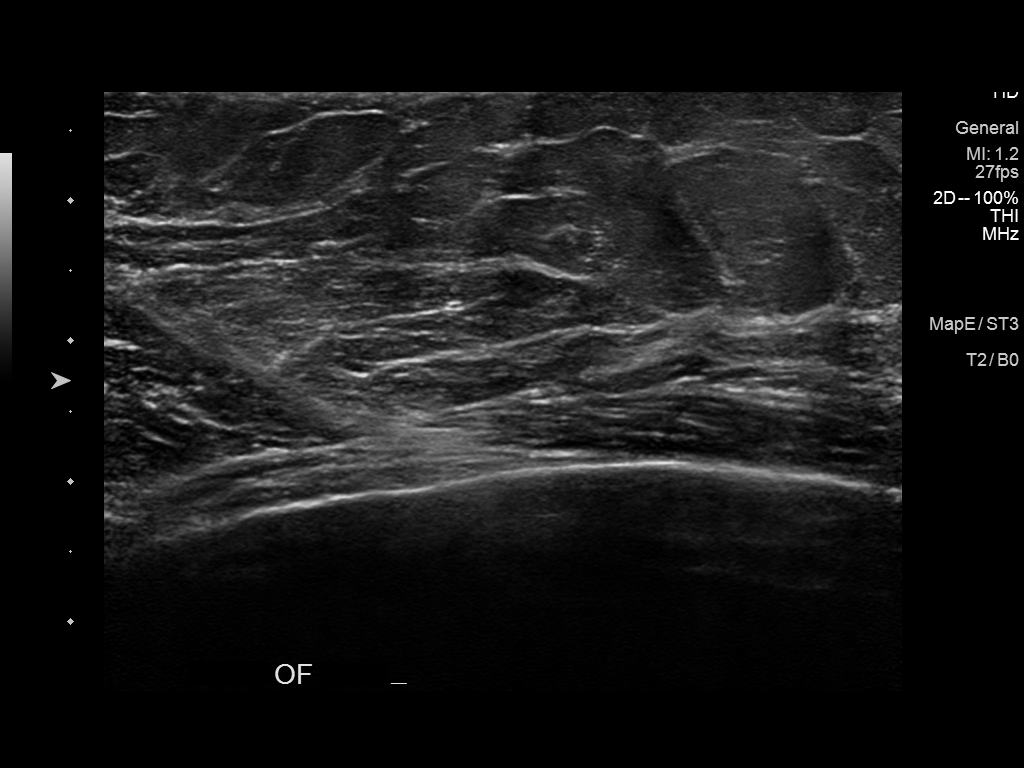
[im 3/5]
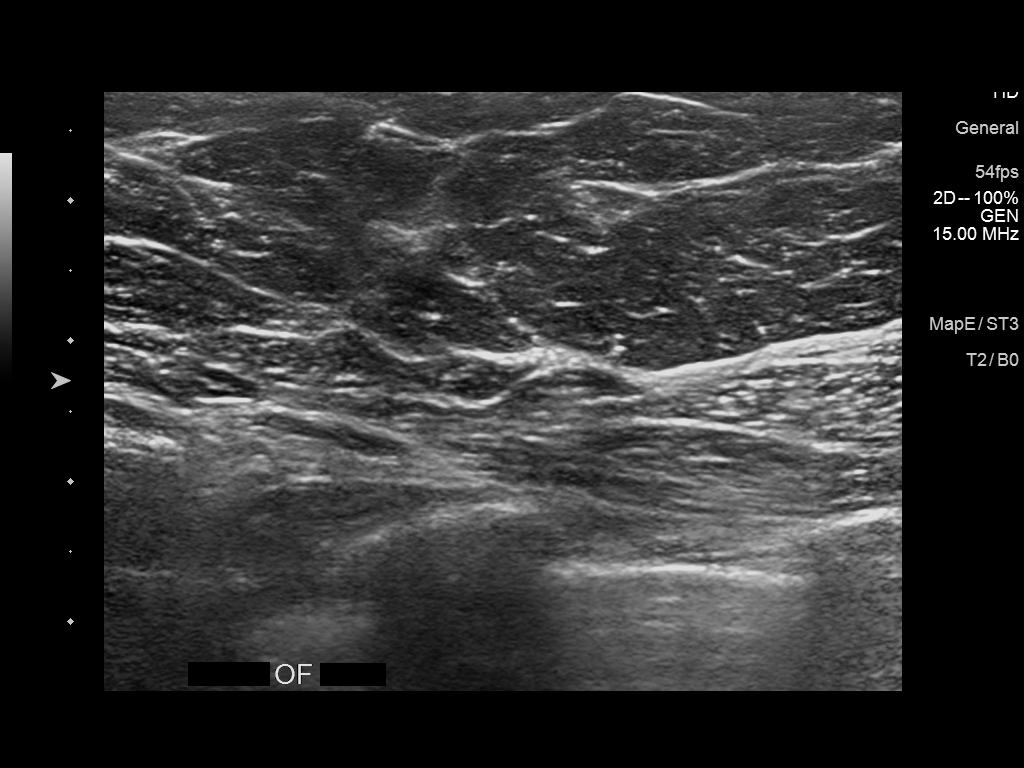
[im 4/5]
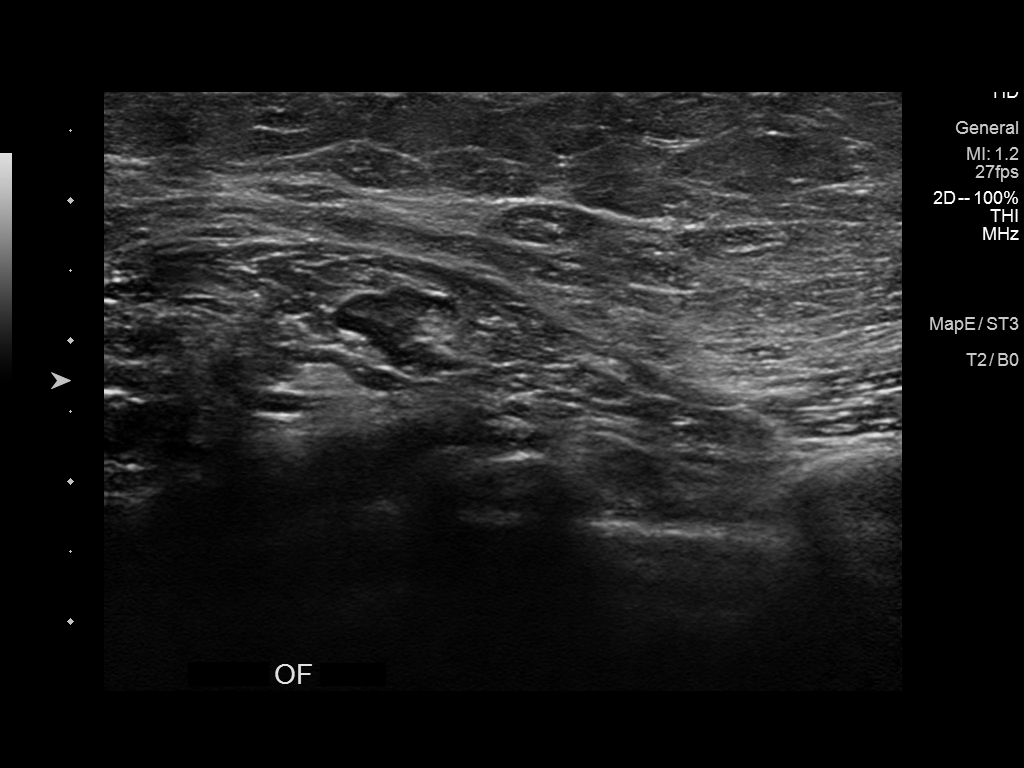
[im 5/5]
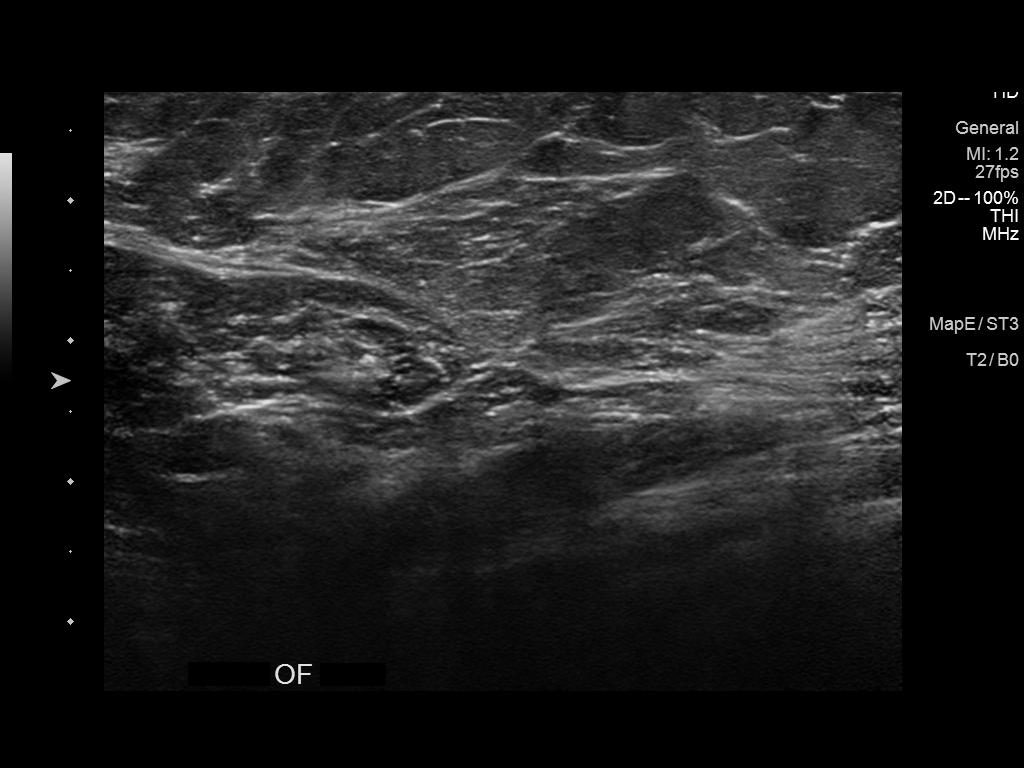

[5 of 5 positions shown; findings below may reference images not displayed]

ACR Breast Density Category c: The breast tissue is heterogeneously
dense, which may obscure small masses.
FINDINGS: Full field CC, MLO and laterally exaggerated CC views of both
breasts and a spot tangential view of the area of focal pain in the
RIGHT breast were obtained. The site of focal pain was marked with a
metallic BB.

RIGHT: No findings suspicious for malignancy. No mammographic
abnormality in the outer breast in the area of focal pain; normal
dense fibroglandular tissue is present in this location.

Targeted ultrasound is performed in the area of focal pain,
demonstrating normal fibroglandular tissue at the 9:30 o'clock
position 7 cm from the nipple. At this location at far posterior
depth is a normal appearing intramammary lymph node which has been
present on multiple prior mammograms without change.

LEFT: No findings suspicious for malignancy.
IMPRESSION: 1. No mammographic or sonographic evidence of malignancy involving
the RIGHT breast.
2. No mammographic evidence of malignancy involving the LEFT breast.

RECOMMENDATION:
Screening mammogram in one year.(Code:P5-K-M07)

I have discussed the findings and recommendations with the patient.
If applicable, a reminder letter will be sent to the patient
regarding the next appointment.

BI-RADS CATEGORY  1: Negative.

## 2023-12-02 IMAGING — MG DIGITAL DIAGNOSTIC BILAT W/ TOMO W/ CAD
8 of 14 series · 8 of 40 positions shown · non-contrast
Comparison: Previous exam(s).

CLINICAL DATA: 66-year-old presenting with focal pain in the outer
RIGHT breast which began approximately 6 months ago at the time of a
COVID diagnosis. Annual evaluation, LEFT breast.

EXAM:
DIGITAL DIAGNOSTIC BILATERAL MAMMOGRAM WITH TOMOSYNTHESIS AND CAD;
ULTRASOUND RIGHT BREAST LIMITED
TECHNIQUE: Bilateral digital diagnostic mammography and breast tomosynthesis
was performed. The images were evaluated with computer-aided
detection.; Targeted ultrasound examination of the right breast was
performed.

[L XCCL synth-2D]
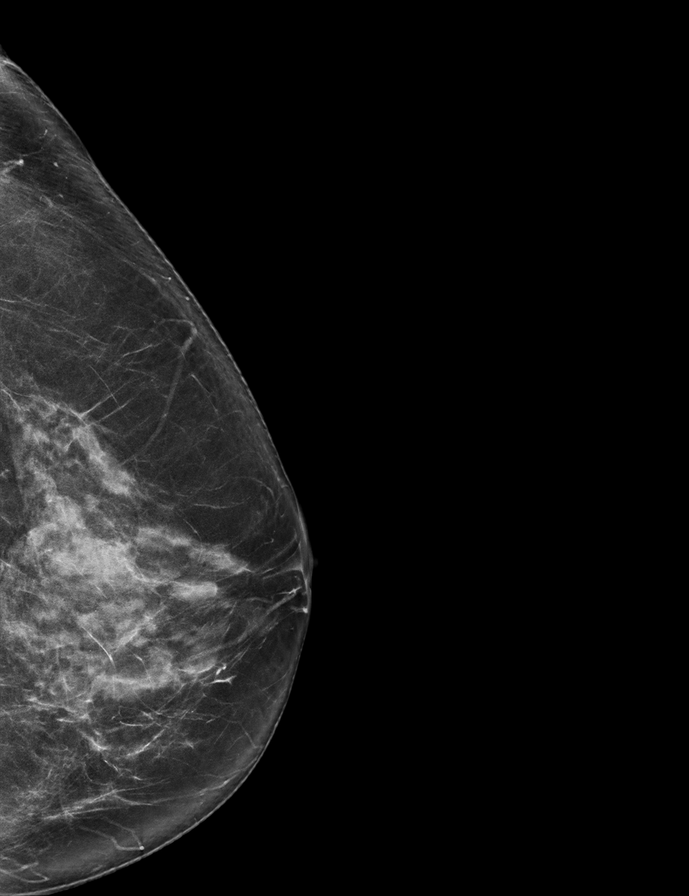

[R XCCL synth-2D (1 of 2)]
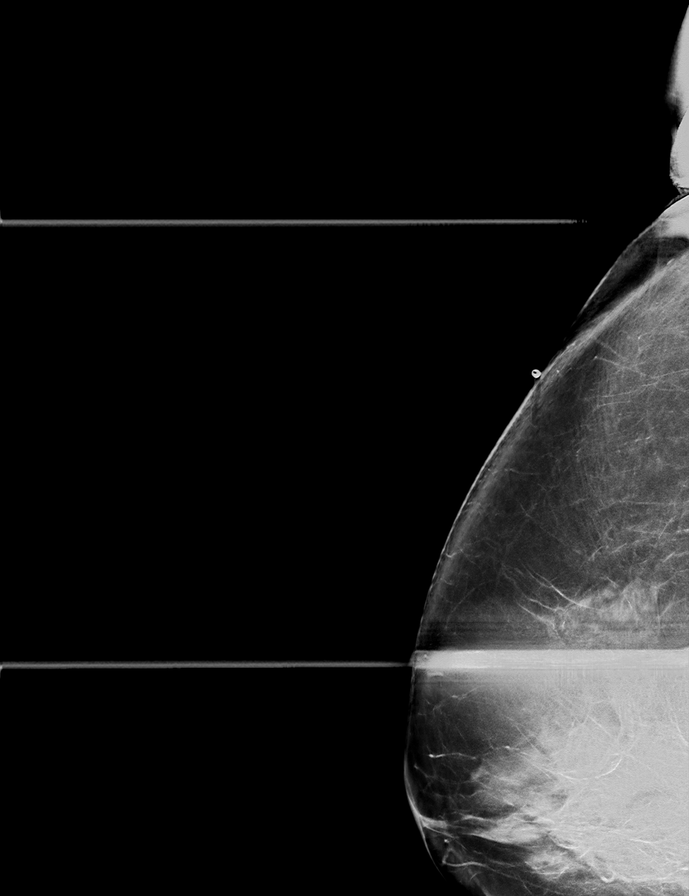

[R CC synth-2D]
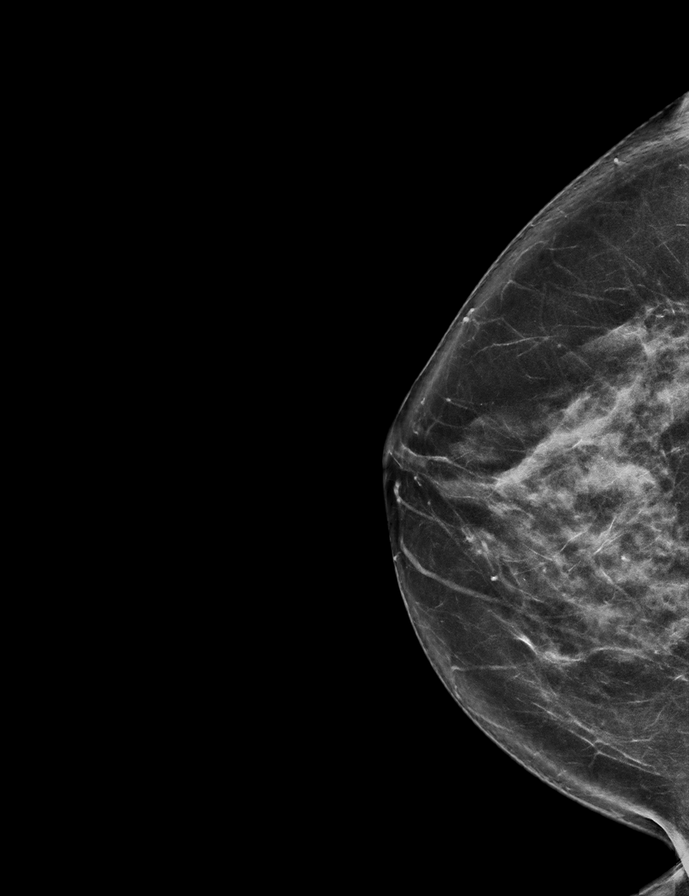

[R XCCL synth-2D (2 of 2)]
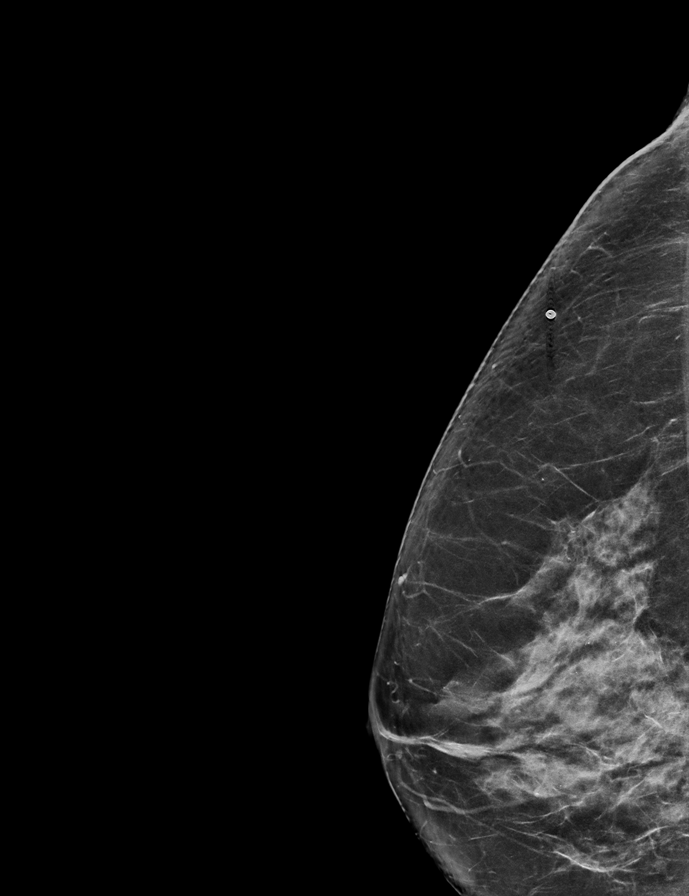

[R MLO synth-2D]
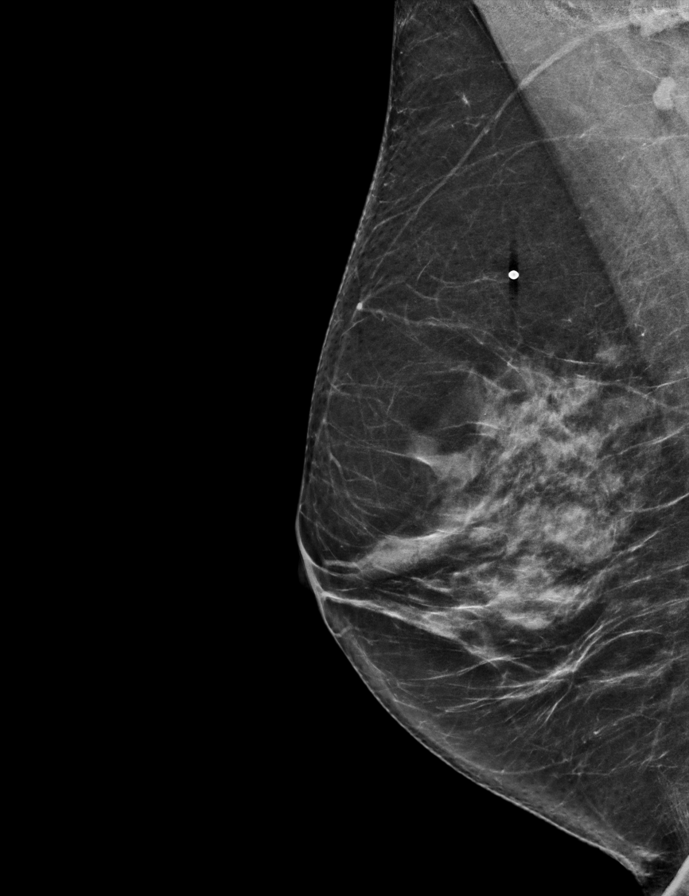

[L CC synth-2D]
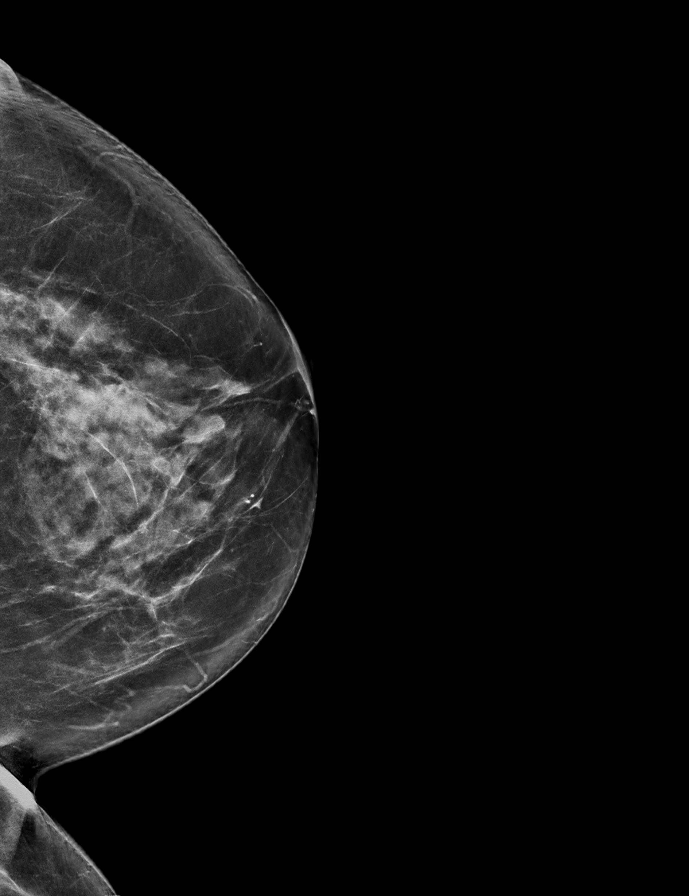

[L MLO synth-2D]
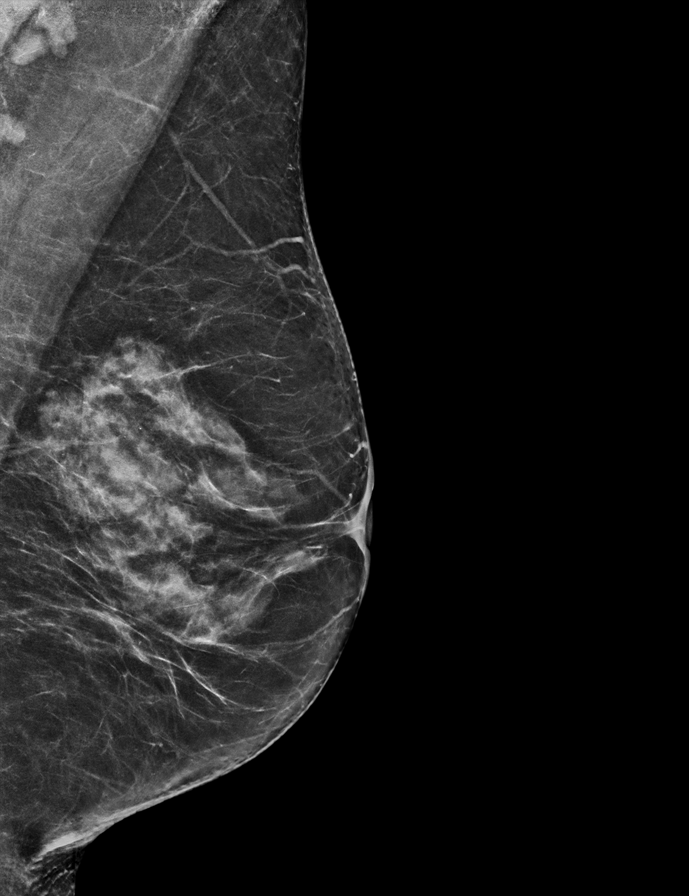

[L CC tomo · tomo slice 39/77.0]
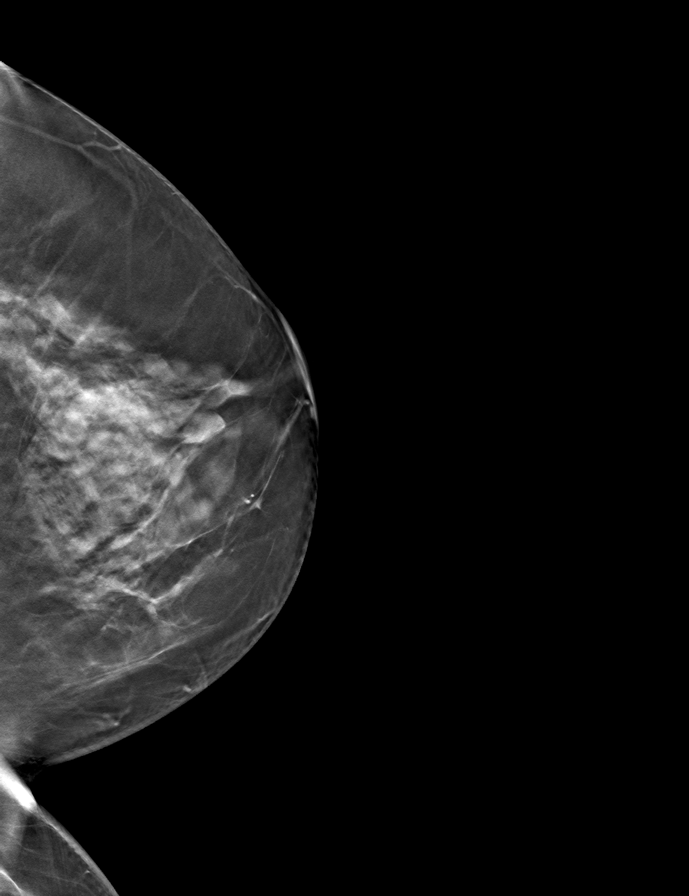

[8 of 40 positions shown; findings below may reference images not displayed]

ACR Breast Density Category c: The breast tissue is heterogeneously
dense, which may obscure small masses.
FINDINGS: Full field CC, MLO and laterally exaggerated CC views of both
breasts and a spot tangential view of the area of focal pain in the
RIGHT breast were obtained. The site of focal pain was marked with a
metallic BB.

RIGHT: No findings suspicious for malignancy. No mammographic
abnormality in the outer breast in the area of focal pain; normal
dense fibroglandular tissue is present in this location.

Targeted ultrasound is performed in the area of focal pain,
demonstrating normal fibroglandular tissue at the 9:30 o'clock
position 7 cm from the nipple. At this location at far posterior
depth is a normal appearing intramammary lymph node which has been
present on multiple prior mammograms without change.

LEFT: No findings suspicious for malignancy.
IMPRESSION: 1. No mammographic or sonographic evidence of malignancy involving
the RIGHT breast.
2. No mammographic evidence of malignancy involving the LEFT breast.

RECOMMENDATION:
Screening mammogram in one year.(Code:P5-K-M07)

I have discussed the findings and recommendations with the patient.
If applicable, a reminder letter will be sent to the patient
regarding the next appointment.

BI-RADS CATEGORY  1: Negative.

## 2024-11-07 ENCOUNTER — Encounter: Admitting: Dermatology
